# Patient Record
Sex: Female | Born: 1995 | Race: White | Hispanic: No | Marital: Single | Smoking: Former smoker
Health system: Southern US, Community
[De-identification: ages and names within clinical notes are randomized; demographics above are authoritative.]

## PROBLEM LIST (undated history)

## (undated) DIAGNOSIS — N159 Renal tubulo-interstitial disease, unspecified: Secondary | ICD-10-CM

## (undated) HISTORY — PX: NO PAST SURGERIES: SHX2092

---
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
## 2017-10-28 ENCOUNTER — Ambulatory Visit: Payer: Self-pay | Admitting: Obstetrics and Gynecology

## 2017-11-24 ENCOUNTER — Emergency Department
Admission: EM | Admit: 2017-11-24 | Discharge: 2017-11-24 | Disposition: A | Discharge status: Home / Self Care | Payer: Managed Care, Other (non HMO) | Attending: Emergency Medicine | Admitting: Emergency Medicine

## 2017-11-24 ENCOUNTER — Other Ambulatory Visit: Payer: Self-pay

## 2017-11-24 ENCOUNTER — Encounter: Payer: Self-pay | Admitting: Emergency Medicine

## 2017-11-24 DIAGNOSIS — J029 Acute pharyngitis, unspecified: Secondary | ICD-10-CM | POA: Diagnosis present

## 2017-11-24 DIAGNOSIS — J020 Streptococcal pharyngitis: Secondary | ICD-10-CM | POA: Insufficient documentation

## 2017-11-24 LAB — GROUP A STREP BY PCR: GROUP A STREP BY PCR: NOT DETECTED

## 2017-11-24 MED ORDER — HYDROCODONE-ACETAMINOPHEN 7.5-325 MG/15ML PO SOLN
ORAL | Status: AC
  Filled 2017-11-24: qty 15

## 2017-11-24 MED ORDER — AMOXICILLIN-POT CLAVULANATE 875-125 MG PO TABS: 1.0000 | ORAL_TABLET | Freq: Two times a day (BID) | ORAL | 0 refills | Status: AC

## 2017-11-24 MED ORDER — HYDROCODONE-ACETAMINOPHEN 7.5-325 MG/15ML PO SOLN
15.0000 mL | Freq: Once | ORAL | Status: AC
Start: 2017-11-24 — End: 2017-11-24
  Administered 2017-11-24: 15 mL via ORAL

## 2017-11-24 MED ORDER — PREDNISOLONE SODIUM PHOSPHATE 15 MG/5ML PO SOLN: 60.0000 mg | Freq: Every day | ORAL | 0 refills | Status: AC

## 2017-11-24 MED ORDER — PENICILLIN G BENZATHINE 600000 UNIT/ML IM SUSP
600000.0000 [IU] | Freq: Once | INTRAMUSCULAR | Status: AC
  Administered 2017-11-24: 600000 [IU] via INTRAMUSCULAR

## 2017-11-24 MED ORDER — PREDNISOLONE SODIUM PHOSPHATE 15 MG/5ML PO SOLN
60.0000 mg | Freq: Once | ORAL | Status: AC
  Administered 2017-11-24: 60 mg via ORAL

## 2017-11-24 MED ORDER — LIDOCAINE VISCOUS 2 % MT SOLN
15.0000 mL | Freq: Once | OROMUCOSAL | Status: AC
  Administered 2017-11-24: 15 mL via OROMUCOSAL
  Filled 2017-11-24: qty 15

## 2017-11-24 MED ORDER — PENICILLIN G BENZATHINE & PROC 1200000 UNIT/2ML IM SUSP
1.2000 10*6.[IU] | Freq: Once | INTRAMUSCULAR | Status: DC
  Filled 2017-11-24: qty 2

## 2017-11-24 NOTE — ED Triage Notes (Signed)
Patient states her pain started up yesterday and tonight its "nearly unbearable" and she has pain talking and swallowing.  Her bilateral tonsils are red with white patches on the right side.  VS WNL.

## 2017-11-24 NOTE — ED Provider Notes (Signed)
[facility] Emergency Department Provider Note   First MD Initiated Contact with Patient 11/24/17 [phone]     (approximate)  I have reviewed the triage vital signs and the nursing notes.   HISTORY  Chief Complaint Sore Throat    HPI Leslie Kerr is a 22 y.o. female presents to the emergency department with a 1 day history of 10 out of 10 throat pain.  Patient admits to tonsillar swelling redness and white patches predominantly on the right side.  Patient denies any fever afebrile on presentation.  Patient states pain is worsened with swallowing.  Patient denies any known sick contact.  Patient does admit to previous history of strep throat   Past medical history Strep pharyngitis There are no active problems to display for this patient.   Past surgical history None  Prior to Admission medications   Medication Sig Start Date End Date Taking? Authorizing Provider  amoxicillin-clavulanate (AUGMENTIN) 875-125 MG tablet Take 1 tablet by mouth 2 (two) times daily for 10 days. 11/24/17 12/04/17  Darci CurrentBrown, [place] N, MD  prednisoLONE (ORAPRED) 15 MG/5ML solution Take 20 mLs (60 mg total) by mouth daily for 5 days. 11/24/17 11/29/17  Darci CurrentBrown, [place] N, MD    Allergies No known drug allergies   Family history Noncontributory  Social History Social History   Tobacco Use  . Smoking status: Never Smoker  . Smokeless tobacco: Never Used  Substance Use Topics  . Alcohol use: No    Frequency: Never  . Drug use: No    Review of Systems Constitutional: No fever/chills Eyes: No visual changes. ENT: Positive for sore throat Cardiovascular: Denies chest pain. Respiratory: Denies shortness of breath. Gastrointestinal: No abdominal pain.  No nausea, no vomiting.  No diarrhea.  No constipation. Genitourinary: Negative for dysuria. Musculoskeletal: Negative for neck pain.  Negative for back pain. Integumentary: Negative for rash. Neurological: Negative for  headaches, focal weakness or numbness.   ____________________________________________   PHYSICAL EXAM:  VITAL SIGNS: ED Triage Vitals  Enc Vitals Group     BP 11/24/17 0521 127/65     Pulse Rate 11/24/17 0521 89     Resp 11/24/17 0521 18     Temp 11/24/17 0521 99.1 F (37.3 C)     Temp Source 11/24/17 0521 Oral     SpO2 11/24/17 0521 97 %     Weight --      Height --      Head Circumference --      Peak Flow --      Pain Score 11/24/17 0522 8     Pain Loc --      Pain Edu? --      Excl. in GC? --     Constitutional: Alert and oriented.  Apparent discomfort.  Eyes: Conjunctivae are normal.  Head: Atraumatic. Ears:  Healthy appearing ear canals and TMs bilaterally Nose: No congestion/rhinnorhea. Mouth/Throat: Mucous membranes are moist.pharyngeal erythema with exudates noted.  Tonsillar hypertrophy right greater than left.   Neck: No stridor.  Palpable anterior cervical lymphadenopathy. Cardiovascular: Normal rate, regular rhythm. Good peripheral circulation. Grossly normal heart sounds. Respiratory: Normal respiratory effort.  No retractions. Lungs CTAB. Gastrointestinal: Soft and nontender. No distention.  Musculoskeletal: No lower extremity tenderness nor edema. No gross deformities of extremities. Neurologic:  Normal speech and language. No gross focal neurologic deficits are appreciated.  Skin:  Skin is warm, dry and intact. No rash noted.   ____________________________________________   LABS (all labs ordered are listed, but only  abnormal results are displayed)  Labs Reviewed  GROUP A STREP BY PCR     Procedures   ____________________________________________   INITIAL IMPRESSION / ASSESSMENT AND PLAN / ED COURSE  As part of my medical decision making, I reviewed the following data within the electronic MEDICAL RECORD NUMBER9 year old female presenting to the emergency department history and physical exam concerning for strep pharyngitis versus other  etiology of pharyngitis versus tonsillitis.  Patient given IM penicillin G 1,200,000 units.  Patient will be prescribed Augmentin for home ____________________________________________  FINAL CLINICAL IMPRESSION(S) / ED DIAGNOSES  Final diagnoses:  Acute pharyngitis, unspecified etiology  Strep throat     MEDICATIONS GIVEN DURING THIS VISIT:  Medications  lidocaine (XYLOCAINE) 2 % viscous mouth solution 15 mL (15 mLs Mouth/Throat Given 11/24/17 0613)  prednisoLONE (ORAPRED) 15 MG/5ML solution 60 mg (60 mg Oral Given 11/24/17 0613)  penicillin G benzathine (BICILLIN-LA) 600000 UNIT/ML injection 600,000 Units (600,000 Units Intramuscular Given 11/24/17 0618)  HYDROcodone-acetaminophen (HYCET) 7.5-325 mg/15 ml solution 15 mL (15 mLs Oral Given 11/24/17 0631)     ED Discharge Orders        Ordered    amoxicillin-clavulanate (AUGMENTIN) 875-125 MG tablet  2 times daily     11/24/17 0632    prednisoLONE (ORAPRED) 15 MG/5ML solution  Daily     11/24/17 1610       Note:  This document was prepared using Dragon voice recognition software and may include unintentional dictation errors.    Darci Current, MD 11/24/17 [phone]

## 2017-12-26 ENCOUNTER — Inpatient Hospital Stay
Admission: EM | Admit: 2017-12-26 | Discharge: 2017-12-28 | DRG: 689 | Disposition: A | Discharge status: Home / Self Care | Payer: Managed Care, Other (non HMO) | Attending: Internal Medicine | Admitting: Internal Medicine

## 2017-12-26 ENCOUNTER — Encounter: Payer: Self-pay | Admitting: Emergency Medicine

## 2017-12-26 ENCOUNTER — Other Ambulatory Visit: Payer: Self-pay

## 2017-12-26 ENCOUNTER — Emergency Department: Payer: Managed Care, Other (non HMO)

## 2017-12-26 DIAGNOSIS — N12 Tubulo-interstitial nephritis, not specified as acute or chronic: Secondary | ICD-10-CM

## 2017-12-26 DIAGNOSIS — N10 Acute tubulo-interstitial nephritis: Principal | ICD-10-CM | POA: Diagnosis present

## 2017-12-26 DIAGNOSIS — A4151 Sepsis due to Escherichia coli [E. coli]: Secondary | ICD-10-CM | POA: Diagnosis present

## 2017-12-26 DIAGNOSIS — R1031 Right lower quadrant pain: Secondary | ICD-10-CM | POA: Diagnosis not present

## 2017-12-26 LAB — COMPREHENSIVE METABOLIC PANEL
ALT: 20 U/L (ref 14–54)
ANION GAP: 11 (ref 5–15)
AST: 17 U/L (ref 15–41)
Albumin: 3.9 g/dL (ref 3.5–5.0)
Alkaline Phosphatase: 77 U/L (ref 38–126)
BUN: 9 mg/dL (ref 6–20)
CALCIUM: 9.2 mg/dL (ref 8.9–10.3)
CHLORIDE: 105 mmol/L (ref 101–111)
CO2: 23 mmol/L (ref 22–32)
Creatinine, Ser: 0.69 mg/dL (ref 0.44–1.00)
Glucose, Bld: 112 mg/dL — ABNORMAL HIGH (ref 65–99)
Potassium: 3.7 mmol/L (ref 3.5–5.1)
SODIUM: 139 mmol/L (ref 135–145)
Total Bilirubin: 1.3 mg/dL — ABNORMAL HIGH (ref 0.3–1.2)
Total Protein: 7.6 g/dL (ref 6.5–8.1)

## 2017-12-26 LAB — URINALYSIS, COMPLETE (UACMP) WITH MICROSCOPIC
Bilirubin Urine: NEGATIVE
GLUCOSE, UA: NEGATIVE mg/dL
KETONES UR: 5 mg/dL — AB
Nitrite: POSITIVE — AB
PH: 6 (ref 5.0–8.0)
Protein, ur: 100 mg/dL — AB
SPECIFIC GRAVITY, URINE: 1.013 (ref 1.005–1.030)

## 2017-12-26 LAB — CBC
HCT: 42.2 % (ref 35.0–47.0)
HEMOGLOBIN: 14.1 g/dL (ref 12.0–16.0)
MCH: 26.6 pg (ref 26.0–34.0)
MCHC: 33.5 g/dL (ref 32.0–36.0)
MCV: 79.4 fL — ABNORMAL LOW (ref 80.0–100.0)
PLATELETS: 281 10*3/uL (ref 150–440)
RBC: 5.31 MIL/uL — AB (ref 3.80–5.20)
RDW: 14.6 % — ABNORMAL HIGH (ref 11.5–14.5)
WBC: 17.2 10*3/uL — AB (ref 3.6–11.0)

## 2017-12-26 LAB — LACTIC ACID, PLASMA
LACTIC ACID, VENOUS: 0.6 mmol/L (ref 0.5–1.9)
Lactic Acid, Venous: 0.8 mmol/L (ref 0.5–1.9)

## 2017-12-26 LAB — POCT PREGNANCY, URINE: Preg Test, Ur: NEGATIVE

## 2017-12-26 LAB — LIPASE, BLOOD: LIPASE: 25 U/L (ref 11–51)

## 2017-12-26 MED ORDER — ENOXAPARIN SODIUM 40 MG/0.4ML ~~LOC~~ SOLN
40.0000 mg | SUBCUTANEOUS | Status: DC
  Administered 2017-12-26 – 2017-12-27 (×2): 40 mg via SUBCUTANEOUS
  Filled 2017-12-26 (×2): qty 0.4

## 2017-12-26 MED ORDER — ACETAMINOPHEN 325 MG PO TABS
650.0000 mg | ORAL_TABLET | Freq: Four times a day (QID) | ORAL | Status: DC | PRN
  Administered 2017-12-27: 650 mg via ORAL
  Filled 2017-12-26: qty 2

## 2017-12-26 MED ORDER — ONDANSETRON HCL 4 MG/2ML IJ SOLN
4.0000 mg | Freq: Once | INTRAMUSCULAR | Status: AC
  Administered 2017-12-26: 4 mg via INTRAVENOUS
  Filled 2017-12-26: qty 2

## 2017-12-26 MED ORDER — POLYETHYLENE GLYCOL 3350 17 G PO PACK
17.0000 g | PACK | Freq: Every day | ORAL | Status: DC | PRN
  Filled 2017-12-26: qty 1

## 2017-12-26 MED ORDER — OXYCODONE HCL 5 MG PO TABS
5.0000 mg | ORAL_TABLET | ORAL | Status: DC | PRN
  Administered 2017-12-27 – 2017-12-28 (×7): 5 mg via ORAL
  Filled 2017-12-26 (×7): qty 1

## 2017-12-26 MED ORDER — IOPAMIDOL (ISOVUE-300) INJECTION 61%
30.0000 mL | Freq: Once | INTRAVENOUS | Status: AC | PRN
  Administered 2017-12-26: 30 mL via ORAL

## 2017-12-26 MED ORDER — SODIUM CHLORIDE 0.9 % IV BOLUS (SEPSIS)
1000.0000 mL | Freq: Once | INTRAVENOUS | Status: AC
  Administered 2017-12-26: 1000 mL via INTRAVENOUS

## 2017-12-26 MED ORDER — POTASSIUM CHLORIDE IN NACL 20-0.9 MEQ/L-% IV SOLN
INTRAVENOUS | Status: DC
  Administered 2017-12-26 – 2017-12-28 (×5): via INTRAVENOUS
  Filled 2017-12-26 (×8): qty 1000

## 2017-12-26 MED ORDER — ONDANSETRON HCL 4 MG PO TABS: 4.0000 mg | ORAL_TABLET | Freq: Four times a day (QID) | ORAL | Status: DC | PRN

## 2017-12-26 MED ORDER — MORPHINE SULFATE (PF) 4 MG/ML IV SOLN
4.0000 mg | Freq: Once | INTRAVENOUS | Status: AC
  Administered 2017-12-26: 4 mg via INTRAVENOUS
  Filled 2017-12-26: qty 1

## 2017-12-26 MED ORDER — DOCUSATE SODIUM 100 MG PO CAPS
100.0000 mg | ORAL_CAPSULE | Freq: Two times a day (BID) | ORAL | Status: DC
  Administered 2017-12-26 – 2017-12-28 (×4): 100 mg via ORAL
  Filled 2017-12-26 (×4): qty 1

## 2017-12-26 MED ORDER — MORPHINE SULFATE (PF) 2 MG/ML IV SOLN
2.0000 mg | INTRAVENOUS | Status: DC | PRN
  Administered 2017-12-27: 2 mg via INTRAVENOUS
  Filled 2017-12-26: qty 1

## 2017-12-26 MED ORDER — SODIUM CHLORIDE 0.9 % IV SOLN
2.0000 g | Freq: Once | INTRAVENOUS | Status: AC
  Administered 2017-12-26: 2 g via INTRAVENOUS
  Filled 2017-12-26: qty 20

## 2017-12-26 MED ORDER — ONDANSETRON HCL 4 MG/2ML IJ SOLN
4.0000 mg | Freq: Four times a day (QID) | INTRAMUSCULAR | Status: DC | PRN
  Administered 2017-12-27 (×2): 4 mg via INTRAVENOUS
  Filled 2017-12-26 (×2): qty 2

## 2017-12-26 MED ORDER — SODIUM CHLORIDE 0.9 % IV SOLN
1.0000 g | INTRAVENOUS | Status: DC
  Filled 2017-12-26: qty 10

## 2017-12-26 MED ORDER — ACETAMINOPHEN 650 MG RE SUPP: 650.0000 mg | Freq: Four times a day (QID) | RECTAL | Status: DC | PRN

## 2017-12-26 MED ORDER — IOPAMIDOL (ISOVUE-300) INJECTION 61%
100.0000 mL | Freq: Once | INTRAVENOUS | Status: AC | PRN
  Administered 2017-12-26: 100 mL via INTRAVENOUS

## 2017-12-26 NOTE — Progress Notes (Signed)
Pharmacy Antibiotic Note  Leslie Kerr is a 22 y.o. female admitted on 12/26/2017 with UTI.  Pharmacy has been consulted for ceftriaxone dosing.  Plan: Received ceftriaxone 2g IV once in ED.  Will start ceftriaxone 1g IV q24h   Height: 5\' 3"  (160 cm) Weight: 224 lb (101.6 kg) IBW/kg (Calculated) : 52.4  Temp (24hrs), Avg:100 F (37.8 C), Min:100 F (37.8 C), Max:100 F (37.8 C)  Recent Labs  Lab 12/26/17 1251  WBC 17.2*  CREATININE 0.69    Estimated Creatinine Clearance: 127.7 mL/min (by C-G formula based on SCr of 0.69 mg/dL).    No Known Allergies  Antimicrobials this admission: 3/15 Ceftriaxone >>   Dose adjustments this admission:   Microbiology results: 3/15 BCx: sent 3/15 UCx: sent  Thank you for allowing pharmacy to be a part of this patient's care.  Leslie Kerr, PharmD Pharmacy Resident 12/26/2017 6:27 PM

## 2017-12-26 NOTE — H&P (Signed)
[facility] - [place] [facility]   PATIENT NAME: Leslie Kerr    MR#:  [id number]  DATE OF BIRTH:  Jul 01, 1996  DATE OF ADMISSION:  12/26/2017  PRIMARY CARE PHYSICIAN: Patient, No Pcp Per   REQUESTING/REFERRING PHYSICIAN: Dr. Pershing ProudSchaevitz  CHIEF COMPLAINT:   Chief Complaint  Patient presents with  . Abdominal Pain    HISTORY OF PRESENT ILLNESS:  Leslie Kerr  is a 22 y.o. female with no significant past medical history presents to the hospital complaining of 1 day of right flank pain, fever.  Patient here was found to be tachycardic with a fever of 103.  CT scan of the abdomen and pelvis showed right pyelonephritis.  UTI on urinalysis.  Patient continues to be tachycardic in spite of fluid bolus.  Will be admitted as inpatient.  PAST MEDICAL HISTORY:  History reviewed. No pertinent past medical history.  No hypertension or diabetes  PAST SURGICAL HISTORY:  History reviewed. No pertinent surgical history.  SOCIAL HISTORY:   Social History   Tobacco Use  . Smoking status: Never Smoker  . Smokeless tobacco: Never Used  Substance Use Topics  . Alcohol use: No    Frequency: Never    FAMILY HISTORY:  No family history on file. No CAD in the family  DRUG ALLERGIES:  No Known Allergies  REVIEW OF SYSTEMS:   Review of Systems  Constitutional: Positive for malaise/fatigue. Negative for chills and fever.  HENT: Negative for sore throat.   Eyes: Negative for blurred vision, double vision and pain.  Respiratory: Negative for cough, hemoptysis, shortness of breath and wheezing.   Cardiovascular: Negative for chest pain, palpitations, orthopnea and leg swelling.  Gastrointestinal: Positive for abdominal pain and nausea. Negative for constipation, diarrhea, heartburn and vomiting.  Genitourinary: Negative for dysuria and hematuria.  Musculoskeletal: Negative for back pain and joint pain.  Skin: Negative for rash.  Neurological: Positive for weakness.  Negative for sensory change, speech change, focal weakness and headaches.  Endo/Heme/Allergies: Does not bruise/bleed easily.  Psychiatric/Behavioral: Negative for depression. The patient is not nervous/anxious.     MEDICATIONS AT HOME:   Prior to Admission medications   Not on File     VITAL SIGNS:  Blood pressure 126/79, pulse 88, temperature 100 F (37.8 C), temperature source Oral, resp. rate 16, height 5\' 3"  (1.6 m), weight 101.6 kg (224 lb), last menstrual period 12/25/2017, SpO2 98 %.  PHYSICAL EXAMINATION:  Physical Exam  GENERAL:  22 y.o.-year-old patient lying in the bed with no acute distress.  Obese EYES: Pupils equal, round, reactive to light and accommodation. No scleral icterus. Extraocular muscles intact.  HEENT: Head atraumatic, normocephalic. Oropharynx and nasopharynx clear. No oropharyngeal erythema, moist oral mucosa  NECK:  Supple, no jugular venous distention. No thyroid enlargement, no tenderness.  LUNGS: Normal breath sounds bilaterally, no wheezing, rales, rhonchi. No use of accessory muscles of respiration.  CARDIOVASCULAR: S1, S2 normal. No murmurs, rubs, or gallops.  ABDOMEN: Soft, right flank tenderness, nondistended. Bowel sounds present. No organomegaly or mass.  EXTREMITIES: No pedal edema, cyanosis, or clubbing. + 2 pedal & radial pulses b/l.   NEUROLOGIC: Cranial nerves II through XII are intact. No focal Motor or sensory deficits appreciated b/l PSYCHIATRIC: The patient is alert and oriented x 3. Good affect.  SKIN: No obvious rash, lesion, or ulcer.   LABORATORY PANEL:   CBC Recent Labs  Lab 12/26/17 1251  WBC 17.2*  HGB 14.1  HCT 42.2  PLT 281   ------------------------------------------------------------------------------------------------------------------  Chemistries  Recent Labs  Lab 12/26/17 1251  NA 139  K 3.7  CL 105  CO2 23  GLUCOSE 112*  BUN 9  CREATININE 0.69  CALCIUM 9.2  AST 17  ALT 20  ALKPHOS 77  BILITOT  1.3*   ------------------------------------------------------------------------------------------------------------------  Cardiac Enzymes No results for input(s): TROPONINI in the last 168 hours. ------------------------------------------------------------------------------------------------------------------  RADIOLOGY:  US Transvaginal Non-ob  Result Date: 12/26/2017 CLINICAL DATA:  Right-sided pelvic pain EXAM: TRANSABDOMINAL AND TRANSVAGINAL ULTRASOUND OF PELVIS DOPPLER ULTRASOUND OF OVARIES TECHNIQUE: Study was performed transabdominally to optimize pelvic field of view evaluation and transvaginally to optimize internal visceral architecture evaluation. Color and duplex Doppler ultrasound was utilized to evaluate blood flow to the ovaries. COMPARISON:  September 01, 2017 FINDINGS: Uterus Measurements: 6.6 x 2.9 x 3.8 cm. No fibroids or other mass visualized. Endometrium Thickness: 3 mm.  No focal abnormality visualized. Right ovary Measurements: 4.8 x 2.4 x 1.8 cm. Normal appearance/no adnexal mass. Left ovary Measurements: 3.1 x 1.2 x 1.8 cm. Normal appearance/no adnexal mass. Pulsed Doppler evaluation of both ovaries demonstrates normal low-resistance arterial and venous waveforms. Other findings No abnormal free fluid. IMPRESSION: No intrauterine or extrauterine pelvic or adnexal mass. No inflammatory focus. No ovarian torsion on either side. Electronically Signed   By: Bretta Bang III M.D.   On: 12/26/2017 14:59   US Pelvis Complete  Result Date: 12/26/2017 CLINICAL DATA:  Right-sided pelvic pain EXAM: TRANSABDOMINAL AND TRANSVAGINAL ULTRASOUND OF PELVIS DOPPLER ULTRASOUND OF OVARIES TECHNIQUE: Study was performed transabdominally to optimize pelvic field of view evaluation and transvaginally to optimize internal visceral architecture evaluation. Color and duplex Doppler ultrasound was utilized to evaluate blood flow to the ovaries. COMPARISON:  September 01, 2017 FINDINGS: Uterus  Measurements: 6.6 x 2.9 x 3.8 cm. No fibroids or other mass visualized. Endometrium Thickness: 3 mm.  No focal abnormality visualized. Right ovary Measurements: 4.8 x 2.4 x 1.8 cm. Normal appearance/no adnexal mass. Left ovary Measurements: 3.1 x 1.2 x 1.8 cm. Normal appearance/no adnexal mass. Pulsed Doppler evaluation of both ovaries demonstrates normal low-resistance arterial and venous waveforms. Other findings No abnormal free fluid. IMPRESSION: No intrauterine or extrauterine pelvic or adnexal mass. No inflammatory focus. No ovarian torsion on either side. Electronically Signed   By: Bretta Bang III M.D.   On: 12/26/2017 14:59   Ct Abdomen Pelvis W Contrast  Result Date: 12/26/2017 CLINICAL DATA:  Abdominal pain. Right lower quadrant pain beginning last evening. Nausea and vomiting. EXAM: CT ABDOMEN AND PELVIS WITH CONTRAST TECHNIQUE: Multidetector CT imaging of the abdomen and pelvis was performed using the standard protocol following bolus administration of intravenous contrast. CONTRAST:  ISOVUE-300 IOPAMIDOL (ISOVUE-300) INJECTION 61% COMPARISON:  Pelvic ultrasound from the same day. CT of the abdomen pelvis 09/01/2014. FINDINGS: Lower chest: The lung bases are clear without focal nodule, mass, or airspace disease. The heart size is normal. No significant pleural or pericardial effusion is present. Hepatobiliary: No focal liver abnormality is seen. No gallstones, gallbladder wall thickening, or biliary dilatation. Pancreas: Unremarkable. No pancreatic ductal dilatation or surrounding inflammatory changes. Spleen: Normal in size without focal abnormality. Adrenals/Urinary Tract: Adrenal glands are normal bilaterally. The left kidney in ureter are within normal limits. There is slight heterogeneity of the right kidney. There is enhancement of the mucosa in the right renal collecting system and ureter. No obstructing mass lesion is present. The urinary bladder is within normal limits.  Stomach/Bowel: The stomach is mildly distended without obstruction. The duodenum is normal. Small bowel  is within normal limits. The terminal ileum is visualized and normal. The appendix is within normal limits. The ascending and transverse colon are. The descending and sigmoid colon are unremarkable. Vascular/Lymphatic: No significant vascular findings are present. No enlarged abdominal or pelvic lymph nodes. Reproductive: Uterus and bilateral adnexa are unremarkable. Other: No abdominal wall hernia or abnormality. No abdominopelvic ascites. Musculoskeletal: Vertebral body heights and alignment are normal. No focal lytic or blastic lesions are evident. IMPRESSION: 1. Subtle heterogeneous enhancement of the right kidney inflammatory changes as well as inflammatory changes about the right ureter raising concern for pyelonephritis in the setting of a urinary tract infection. 2. The left kidney is unremarkable. 3. Normal appearance of the appendix. Electronically Signed   By: Marin Roberts M.D.   On: 12/26/2017 16:08   [place] Art/ven Flow Abd Pelv Doppler  Result Date: 12/26/2017 CLINICAL DATA:  Right-sided pelvic pain EXAM: TRANSABDOMINAL AND TRANSVAGINAL ULTRASOUND OF PELVIS DOPPLER ULTRASOUND OF OVARIES TECHNIQUE: Study was performed transabdominally to optimize pelvic field of view evaluation and transvaginally to optimize internal visceral architecture evaluation. Color and duplex Doppler ultrasound was utilized to evaluate blood flow to the ovaries. COMPARISON:  September 01, 2017 FINDINGS: Uterus Measurements: 6.6 x 2.9 x 3.8 cm. No fibroids or other mass visualized. Endometrium Thickness: 3 mm.  No focal abnormality visualized. Right ovary Measurements: 4.8 x 2.4 x 1.8 cm. Normal appearance/no adnexal mass. Left ovary Measurements: 3.1 x 1.2 x 1.8 cm. Normal appearance/no adnexal mass. Pulsed Doppler evaluation of both ovaries demonstrates normal low-resistance arterial and venous waveforms. Other  findings No abnormal free fluid. IMPRESSION: No intrauterine or extrauterine pelvic or adnexal mass. No inflammatory focus. No ovarian torsion on either side. Electronically Signed   By: Bretta Bang III M.D.   On: 12/26/2017 14:59     IMPRESSION AND PLAN:   *Acute right pyelonephritis with sepsis present on admission Stat IV fluid bolus.  IV ceftriaxone.  Urine and blood cultures. Patient continues to have fever of 103 and tachycardic with severe pain.  Pain medications as needed.  *Leukocytosis secondary to acute infection  *DVT prophylaxis with Lovenox  All the records are reviewed and case discussed with ED provider. Management plans discussed with the patient, family and they are in agreement.  CODE STATUS: Full code  TOTAL CC TIME TAKING CARE OF THIS PATIENT: 40 minutes.   Orie Fisherman M.D on 12/26/2017 at 6:27 PM  Between 7am to 6pm - Pager - [phone]  After 6pm go to [email or web address] - password [email or web address]  [facility] [place] [facility]  Office  [phone]  CC: Primary care physician; Patient, No Pcp Per  Note: This dictation was prepared with Dragon dictation along with smaller phrase technology. Any transcriptional errors that result from this process are unintentional.

## 2017-12-26 NOTE — ED Provider Notes (Signed)
[facility] Emergency Department Provider Note  ___________________________________________   First MD Initiated Contact with Patient 12/26/17 1455     (approximate)  I have reviewed the triage vital signs and the nursing notes.   HISTORY  Chief Complaint Abdominal Pain   HPI Leslie Kerr is a 22 y.o. female without any chronic medical conditions was presented with right lower quadrant abdominal pain which she describes as a sharp 7 out of 10 at this time.  She says that it is worse with movement.  Says the pain started last night and has been progressively worsening.  She states that she has vomited several times today.  Denies any burning or frequency with urination.  No history of UTI.  No history of abdominal surgeries.  Patient denies any vaginal bleeding or discharge.  Patient says that the pain was of gradual onset.  History reviewed. No pertinent past medical history.  There are no active problems to display for this patient.   History reviewed. No pertinent surgical history.  Prior to Admission medications   Not on File    Allergies Patient has no known allergies.  No family history on file.  Social History Social History   Tobacco Use  . Smoking status: Never Smoker  . Smokeless tobacco: Never Used  Substance Use Topics  . Alcohol use: No    Frequency: Never  . Drug use: No    Review of Systems  Constitutional: No fever/chills Eyes: No visual changes. ENT: No sore throat. Cardiovascular: Denies chest pain. Respiratory: Denies shortness of breath. Gastrointestinal: No diarrhea.  No constipation. Genitourinary: Negative for dysuria. Musculoskeletal: Negative for back pain. Skin: Negative for rash. Neurological: Negative for headaches, focal weakness or numbness.   ____________________________________________   PHYSICAL EXAM:  VITAL SIGNS: ED Triage Vitals  Enc Vitals Group     BP 12/26/17 1244 120/77     Pulse  Rate 12/26/17 1244 (!) 112     Resp 12/26/17 1244 17     Temp 12/26/17 1244 100 F (37.8 C)     Temp Source 12/26/17 1244 Oral     SpO2 12/26/17 1244 92 %     Weight 12/26/17 1245 224 lb (101.6 kg)     Height 12/26/17 1245 5\' 3"  (1.6 m)     Head Circumference --      Peak Flow --      Pain Score 12/26/17 1245 8     Pain Loc --      Pain Edu? --      Excl. in GC? --     Constitutional: Alert and oriented.  Patient appears uncomfortable. Eyes: Conjunctivae are normal.  Head: Atraumatic. Nose: No congestion/rhinnorhea. Mouth/Throat: Mucous membranes are moist.  Neck: No stridor.   Cardiovascular: Normal rate, regular rhythm. Grossly normal heart sounds.  Respiratory: Normal respiratory effort.  No retractions. Lungs CTAB. Gastrointestinal: Soft with moderate to severe right lower quadrant tenderness over McBurney's point.  No rigidity.  Also with right CVA tenderness to palpation that is mild to moderate.   Musculoskeletal: No lower extremity tenderness nor edema.  No joint effusions. Neurologic:  Normal speech and language. No gross focal neurologic deficits are appreciated. Skin:  Skin is warm, dry and intact. No rash noted. Psychiatric: Mood and affect are normal. Speech and behavior are normal.  ____________________________________________   LABS (all labs ordered are listed, but only abnormal results are displayed)  Labs Reviewed  COMPREHENSIVE METABOLIC PANEL - Abnormal; Notable for the following components:  Result Value   Glucose, Bld 112 (*)    Total Bilirubin 1.3 (*)    All other components within normal limits  CBC - Abnormal; Notable for the following components:   WBC 17.2 (*)    RBC 5.31 (*)    MCV 79.4 (*)    RDW 14.6 (*)    All other components within normal limits  URINALYSIS, COMPLETE (UACMP) WITH MICROSCOPIC - Abnormal; Notable for the following components:   Color, Urine YELLOW (*)    APPearance CLOUDY (*)    Hgb urine dipstick MODERATE (*)     Ketones, ur 5 (*)    Protein, ur 100 (*)    Nitrite POSITIVE (*)    Leukocytes, UA MODERATE (*)    Bacteria, UA MANY (*)    Squamous Epithelial / LPF 0-5 (*)    All other components within normal limits  URINE CULTURE  CULTURE, BLOOD (ROUTINE X 2)  CULTURE, BLOOD (ROUTINE X 2)  LIPASE, BLOOD  LACTIC ACID, PLASMA  LACTIC ACID, PLASMA  POCT PREGNANCY, URINE  POC URINE PREG, ED   ____________________________________________  EKG  ________________________________________  RADIOLOGY  Ultrasound of the pelvis without any acute pathology. Pt with ct findings c/w pyelonephritis.  ____________________________________________   PROCEDURES  Procedure(s) performed:   Procedures  Critical Care performed:   ____________________________________________   INITIAL IMPRESSION / ASSESSMENT AND PLAN / ED COURSE  Pertinent labs & imaging results that were available during my care of the patient were reviewed by me and considered in my medical decision making (see chart for details).  Differential diagnosis includes, but is not limited to, acute appendicitis, renal colic, testicular torsion, urinary tract infection/pyelonephritis, prostatitis,  epididymitis, diverticulitis, small bowel obstruction or ileus, colitis, abdominal aortic aneurysm, gastroenteritis, hernia, etc. As part of my medical decision making, I reviewed the following data within the electronic MEDICAL RECORD NUMBER Notes from prior ED visits  ----------------------------------------- 5:54 PM on 12/26/2017 -----------------------------------------  Patient at this time with Reiger's.  I retook her temperature and it is 103.  Also says that the pain is increasing.  Sepsis alert called.  Patient to be started on ceftriaxone.  To be admitted to the hospital because of clinical worsening and now febrile.  Also with a heart rate of 125 at this time.  Patient will need for admission.  Signed out to Dr.  Elpidio Anis. ____________________________________________   FINAL CLINICAL IMPRESSION(S) / ED DIAGNOSES  Pyelonephritis.    NEW MEDICATIONS STARTED DURING THIS VISIT:  New Prescriptions   No medications on file     Note:  This document was prepared using Dragon voice recognition software and may include unintentional dictation errors.     Myrna Blazer, MD 12/26/17 [phone]

## 2017-12-26 NOTE — ED Triage Notes (Signed)
Patient presents to ED via [facility] from [facility] for abdominal pain. Patient reports RLQ pain since last night. Denies diarrhea, reports N/V.

## 2017-12-26 NOTE — ED Notes (Signed)
Dr. Scotty CourtStafford and Dr. Alphonzo LemmingsMcShane notified of patient presentation and positive psoas sign. See orders.

## 2017-12-27 LAB — CBC
HCT: 37.3 % (ref 35.0–47.0)
HEMOGLOBIN: 12 g/dL (ref 12.0–16.0)
MCH: 26.3 pg (ref 26.0–34.0)
MCHC: 32.1 g/dL (ref 32.0–36.0)
MCV: 81.9 fL (ref 80.0–100.0)
PLATELETS: 215 10*3/uL (ref 150–440)
RBC: 4.55 MIL/uL (ref 3.80–5.20)
RDW: 14.9 % — ABNORMAL HIGH (ref 11.5–14.5)
WBC: 13.4 10*3/uL — AB (ref 3.6–11.0)

## 2017-12-27 LAB — BASIC METABOLIC PANEL
ANION GAP: 9 (ref 5–15)
BUN: 7 mg/dL (ref 6–20)
CHLORIDE: 111 mmol/L (ref 101–111)
CO2: 20 mmol/L — AB (ref 22–32)
Calcium: 8.4 mg/dL — ABNORMAL LOW (ref 8.9–10.3)
Creatinine, Ser: 0.68 mg/dL (ref 0.44–1.00)
GFR calc non Af Amer: >60 mL/min (ref >60)
Glucose, Bld: 95 mg/dL (ref 65–99)
POTASSIUM: 3.9 mmol/L (ref 3.5–5.1)
SODIUM: 140 mmol/L (ref 135–145)

## 2017-12-27 LAB — BLOOD CULTURE ID PANEL (REFLEXED)
ACINETOBACTER BAUMANNII: NOT DETECTED
CANDIDA GLABRATA: NOT DETECTED
CANDIDA TROPICALIS: NOT DETECTED
Candida albicans: NOT DETECTED
Candida krusei: NOT DETECTED
Candida parapsilosis: NOT DETECTED
Carbapenem resistance: NOT DETECTED
ENTEROBACTER CLOACAE COMPLEX: NOT DETECTED
ESCHERICHIA COLI: DETECTED — AB
Enterobacteriaceae species: DETECTED — AB
Enterococcus species: NOT DETECTED
Haemophilus influenzae: NOT DETECTED
Klebsiella oxytoca: NOT DETECTED
Klebsiella pneumoniae: NOT DETECTED
LISTERIA MONOCYTOGENES: NOT DETECTED
NEISSERIA MENINGITIDIS: NOT DETECTED
PROTEUS SPECIES: NOT DETECTED
Pseudomonas aeruginosa: NOT DETECTED
SERRATIA MARCESCENS: NOT DETECTED
STAPHYLOCOCCUS SPECIES: NOT DETECTED
STREPTOCOCCUS AGALACTIAE: NOT DETECTED
STREPTOCOCCUS PNEUMONIAE: NOT DETECTED
Staphylococcus aureus (BCID): NOT DETECTED
Streptococcus pyogenes: NOT DETECTED
Streptococcus species: NOT DETECTED

## 2017-12-27 MED ORDER — SODIUM CHLORIDE 0.9 % IV SOLN
2.0000 g | INTRAVENOUS | Status: DC
  Administered 2017-12-27: 2 g via INTRAVENOUS
  Filled 2017-12-27 (×2): qty 20

## 2017-12-27 NOTE — Progress Notes (Signed)
[facility] [place] [facility]   PATIENT NAME: Leslie Kerr    MR#:  [id number]  DATE OF BIRTH:  04-28-1996  SUBJECTIVE:  Came in with fever and flank pain found to have UTI with pyelonephritis feels a little better.  Able to tolerate oral diet and fluids REVIEW OF SYSTEMS:   Review of Systems  Constitutional: Negative for chills, fever and weight loss.  HENT: Negative for ear discharge, ear pain and nosebleeds.   Eyes: Negative for blurred vision, pain and discharge.  Respiratory: Negative for sputum production, shortness of breath, wheezing and stridor.   Cardiovascular: Negative for chest pain, palpitations, orthopnea and PND.  Gastrointestinal: Negative for abdominal pain, diarrhea, nausea and vomiting.  Genitourinary: Positive for flank pain. Negative for frequency and urgency.  Musculoskeletal: Negative for back pain and joint pain.  Neurological: Positive for weakness. Negative for sensory change, speech change and focal weakness.  Psychiatric/Behavioral: Negative for depression and hallucinations. The patient is not nervous/anxious.    Tolerating Diet:yes Tolerating PT: not needed  DRUG ALLERGIES:  No Known Allergies  VITALS:  Blood pressure (!) 107/57, pulse 79, temperature 98.8 F (37.1 C), temperature source Oral, resp. rate 17, height 5\' 3"  (1.6 m), weight 101.6 kg (224 lb), last menstrual period 12/25/2017, SpO2 99 %.  PHYSICAL EXAMINATION:   Physical Exam  GENERAL:  22 y.o.-year-old patient lying in the bed with no acute distress.  EYES: Pupils equal, round, reactive to light and accommodation. No scleral icterus. Extraocular muscles intact.  HEENT: Head atraumatic, normocephalic. Oropharynx and nasopharynx clear.  NECK:  Supple, no jugular venous distention. No thyroid enlargement, no tenderness.  LUNGS: Normal breath sounds bilaterally, no wheezing, rales, rhonchi. No use of accessory muscles of respiration.  CARDIOVASCULAR: S1,  S2 normal. No murmurs, rubs, or gallops.  ABDOMEN: Soft, nontender, nondistended. Bowel sounds present. No organomegaly or mass.  EXTREMITIES: No cyanosis, clubbing or edema b/l.    NEUROLOGIC: Cranial nerves II through XII are intact. No focal Motor or sensory deficits b/l.   PSYCHIATRIC:  patient is alert and oriented x 3.  SKIN: No obvious rash, lesion, or ulcer.   LABORATORY PANEL:  CBC Recent Labs  Lab 12/27/17 0534  WBC 13.4*  HGB 12.0  HCT 37.3  PLT 215    Chemistries  Recent Labs  Lab 12/26/17 1251 12/27/17 0534  NA 139 140  K 3.7 3.9  CL 105 111  CO2 23 20*  GLUCOSE 112* 95  BUN 9 7  CREATININE 0.69 0.68  CALCIUM 9.2 8.4*  AST 17  --   ALT 20  --   ALKPHOS 77  --   BILITOT 1.3*  --    Cardiac Enzymes No results for input(s): TROPONINI in the last 168 hours. RADIOLOGY:  US Transvaginal Non-ob  Result Date: 12/26/2017 CLINICAL DATA:  Right-sided pelvic pain EXAM: TRANSABDOMINAL AND TRANSVAGINAL ULTRASOUND OF PELVIS DOPPLER ULTRASOUND OF OVARIES TECHNIQUE: Study was performed transabdominally to optimize pelvic field of view evaluation and transvaginally to optimize internal visceral architecture evaluation. Color and duplex Doppler ultrasound was utilized to evaluate blood flow to the ovaries. COMPARISON:  September 01, 2017 FINDINGS: Uterus Measurements: 6.6 x 2.9 x 3.8 cm. No fibroids or other mass visualized. Endometrium Thickness: 3 mm.  No focal abnormality visualized. Right ovary Measurements: 4.8 x 2.4 x 1.8 cm. Normal appearance/no adnexal mass. Left ovary Measurements: 3.1 x 1.2 x 1.8 cm. Normal appearance/no adnexal mass. Pulsed Doppler evaluation of both ovaries demonstrates normal low-resistance arterial  and venous waveforms. Other findings No abnormal free fluid. IMPRESSION: No intrauterine or extrauterine pelvic or adnexal mass. No inflammatory focus. No ovarian torsion on either side. Electronically Signed   By: Bretta Bang III M.D.   On: 12/26/2017  14:59   US Pelvis Complete  Result Date: 12/26/2017 CLINICAL DATA:  Right-sided pelvic pain EXAM: TRANSABDOMINAL AND TRANSVAGINAL ULTRASOUND OF PELVIS DOPPLER ULTRASOUND OF OVARIES TECHNIQUE: Study was performed transabdominally to optimize pelvic field of view evaluation and transvaginally to optimize internal visceral architecture evaluation. Color and duplex Doppler ultrasound was utilized to evaluate blood flow to the ovaries. COMPARISON:  September 01, 2017 FINDINGS: Uterus Measurements: 6.6 x 2.9 x 3.8 cm. No fibroids or other mass visualized. Endometrium Thickness: 3 mm.  No focal abnormality visualized. Right ovary Measurements: 4.8 x 2.4 x 1.8 cm. Normal appearance/no adnexal mass. Left ovary Measurements: 3.1 x 1.2 x 1.8 cm. Normal appearance/no adnexal mass. Pulsed Doppler evaluation of both ovaries demonstrates normal low-resistance arterial and venous waveforms. Other findings No abnormal free fluid. IMPRESSION: No intrauterine or extrauterine pelvic or adnexal mass. No inflammatory focus. No ovarian torsion on either side. Electronically Signed   By: Bretta Bang III M.D.   On: 12/26/2017 14:59   Ct Abdomen Pelvis W Contrast  Result Date: 12/26/2017 CLINICAL DATA:  Abdominal pain. Right lower quadrant pain beginning last evening. Nausea and vomiting. EXAM: CT ABDOMEN AND PELVIS WITH CONTRAST TECHNIQUE: Multidetector CT imaging of the abdomen and pelvis was performed using the standard protocol following bolus administration of intravenous contrast. CONTRAST:  ISOVUE-300 IOPAMIDOL (ISOVUE-300) INJECTION 61% COMPARISON:  Pelvic ultrasound from the same day. CT of the abdomen pelvis 09/01/2014. FINDINGS: Lower chest: The lung bases are clear without focal nodule, mass, or airspace disease. The heart size is normal. No significant pleural or pericardial effusion is present. Hepatobiliary: No focal liver abnormality is seen. No gallstones, gallbladder wall thickening, or biliary  dilatation. Pancreas: Unremarkable. No pancreatic ductal dilatation or surrounding inflammatory changes. Spleen: Normal in size without focal abnormality. Adrenals/Urinary Tract: Adrenal glands are normal bilaterally. The left kidney in ureter are within normal limits. There is slight heterogeneity of the right kidney. There is enhancement of the mucosa in the right renal collecting system and ureter. No obstructing mass lesion is present. The urinary bladder is within normal limits. Stomach/Bowel: The stomach is mildly distended without obstruction. The duodenum is normal. Small bowel is within normal limits. The terminal ileum is visualized and normal. The appendix is within normal limits. The ascending and transverse colon are. The descending and sigmoid colon are unremarkable. Vascular/Lymphatic: No significant vascular findings are present. No enlarged abdominal or pelvic lymph nodes. Reproductive: Uterus and bilateral adnexa are unremarkable. Other: No abdominal wall hernia or abnormality. No abdominopelvic ascites. Musculoskeletal: Vertebral body heights and alignment are normal. No focal lytic or blastic lesions are evident. IMPRESSION: 1. Subtle heterogeneous enhancement of the right kidney inflammatory changes as well as inflammatory changes about the right ureter raising concern for pyelonephritis in the setting of a urinary tract infection. 2. The left kidney is unremarkable. 3. Normal appearance of the appendix. Electronically Signed   By: Marin Roberts M.D.   On: 12/26/2017 16:08   [place] Art/ven Flow Abd Pelv Doppler  Result Date: 12/26/2017 CLINICAL DATA:  Right-sided pelvic pain EXAM: TRANSABDOMINAL AND TRANSVAGINAL ULTRASOUND OF PELVIS DOPPLER ULTRASOUND OF OVARIES TECHNIQUE: Study was performed transabdominally to optimize pelvic field of view evaluation and transvaginally to optimize internal visceral architecture evaluation. Color and duplex Doppler  ultrasound was utilized to evaluate  blood flow to the ovaries. COMPARISON:  September 01, 2017 FINDINGS: Uterus Measurements: 6.6 x 2.9 x 3.8 cm. No fibroids or other mass visualized. Endometrium Thickness: 3 mm.  No focal abnormality visualized. Right ovary Measurements: 4.8 x 2.4 x 1.8 cm. Normal appearance/no adnexal mass. Left ovary Measurements: 3.1 x 1.2 x 1.8 cm. Normal appearance/no adnexal mass. Pulsed Doppler evaluation of both ovaries demonstrates normal low-resistance arterial and venous waveforms. Other findings No abnormal free fluid. IMPRESSION: No intrauterine or extrauterine pelvic or adnexal mass. No inflammatory focus. No ovarian torsion on either side. Electronically Signed   By: Bretta BangWilliam  Woodruff III M.D.   On: 12/26/2017 14:59   ASSESSMENT AND PLAN:  Leslie Kerr  is a 22 y.o. female with no significant past medical history presents to the hospital complaining of 1 day of right flank pain, fever.  Patient here was found to be tachycardic with a fever of 103.  CT scan of the abdomen and pelvis showed right pyelonephritis.  UTI on urinalysis.  *Acute right pyelonephritis with sepsis present on admission -IV fluids -Continue IV ceftriaxone.  - Urine more than 100,000 colonies of gram-negative rods and blood cultures 1 out of 2 E. coli -Currently afebrile improving  *Leukocytosis secondary to acute infection  *DVT prophylaxis with Lovenox  If continues to show improvement and he remains hemodynamically stable will discharge home tomorrow.  Patient agreeable.  Case discussed with Care Management/Social Worker. Management plans discussed with the patient, family and they are in agreement.  CODE STATUS: Full  DVT Prophylaxis: Lovenox  TOTAL TIME TAKING CARE OF THIS PATIENT: 30 minutes.  >50% time spent on counselling and coordination of care  POSSIBLE D/C IN 1-2 DAYS, DEPENDING ON CLINICAL CONDITION.  Note: This dictation was prepared with Dragon dictation along with smaller phrase technology. Any  transcriptional errors that result from this process are unintentional.  Enedina FinnerSona Rosealie Reach M.D on 12/27/2017 at 3:31 PM  Between 7am to 6pm - Pager - [phone]  After 6pm go to [email or web address] - password [email or web address]  [facility]  Office  [phone]  CC: Primary care physician; Patient, No Pcp PerPatient ID: Leslie Kerr, female   DOB: 1995/10/30, 22 y.o.   MRN: [id number]

## 2017-12-27 NOTE — Progress Notes (Signed)
PHARMACY - PHYSICIAN COMMUNICATION CRITICAL VALUE ALERT - BLOOD CULTURE IDENTIFICATION (BCID)  Leslie Kerr is an 22 y.o. female who [facility] with a chief complaint of Abdominal Pain   Assessment:  DX with Pyelonephritis, now found to have bacteremia    Name of physician (or Provider) Contacted: Dr. Enedina FinnerSona Patel   Current antibiotics: Ceftriaxone   Changes to prescribed antibiotics recommended:  Recommendations declined by provider due to NO history of resistant organism. Will continue with ceftriaxone but increase dose to 2g IV every 24 hours.   Results for orders placed or performed during the hospital encounter of 12/26/17  Blood Culture ID Panel (Reflexed) (Collected: 12/26/2017  6:22 PM)  Result Value Ref Range   Enterococcus species NOT DETECTED NOT DETECTED   Listeria monocytogenes NOT DETECTED NOT DETECTED   Staphylococcus species NOT DETECTED NOT DETECTED   Staphylococcus aureus NOT DETECTED NOT DETECTED   Streptococcus species NOT DETECTED NOT DETECTED   Streptococcus agalactiae NOT DETECTED NOT DETECTED   Streptococcus pneumoniae NOT DETECTED NOT DETECTED   Streptococcus pyogenes NOT DETECTED NOT DETECTED   Acinetobacter baumannii NOT DETECTED NOT DETECTED   Enterobacteriaceae species DETECTED (A) NOT DETECTED   Enterobacter cloacae complex NOT DETECTED NOT DETECTED   Escherichia coli DETECTED (A) NOT DETECTED   Klebsiella oxytoca NOT DETECTED NOT DETECTED   Klebsiella pneumoniae NOT DETECTED NOT DETECTED   Proteus species NOT DETECTED NOT DETECTED   Serratia marcescens NOT DETECTED NOT DETECTED   Carbapenem resistance NOT DETECTED NOT DETECTED   Haemophilus influenzae NOT DETECTED NOT DETECTED   Neisseria meningitidis NOT DETECTED NOT DETECTED   Pseudomonas aeruginosa NOT DETECTED NOT DETECTED   Candida albicans NOT DETECTED NOT DETECTED   Candida glabrata NOT DETECTED NOT DETECTED   Candida krusei NOT DETECTED NOT DETECTED   Candida  parapsilosis NOT DETECTED NOT DETECTED   Candida tropicalis NOT DETECTED NOT DETECTED    Gardner CandleSheema M Genene Kilman, PharmD, BCPS Clinical Pharmacist 12/27/2017 9:38 AM

## 2017-12-28 LAB — URINE CULTURE

## 2017-12-28 LAB — HIV ANTIBODY (ROUTINE TESTING W REFLEX): HIV Screen 4th Generation wRfx: NONREACTIVE

## 2017-12-28 MED ORDER — CEPHALEXIN 500 MG PO CAPS: 500.0000 mg | ORAL_CAPSULE | Freq: Three times a day (TID) | ORAL | 0 refills | Status: AC

## 2017-12-28 MED ORDER — OXYCODONE HCL 5 MG PO TABS: 5.0000 mg | ORAL_TABLET | Freq: Three times a day (TID) | ORAL | 0 refills | Status: DC | PRN

## 2017-12-28 MED ORDER — CEPHALEXIN 500 MG PO CAPS
500.0000 mg | ORAL_CAPSULE | Freq: Three times a day (TID) | ORAL | Status: DC
  Administered 2017-12-28: 500 mg via ORAL
  Filled 2017-12-28: qty 1

## 2017-12-28 NOTE — Discharge Summary (Signed)
[facility] [place] at [facility]   PATIENT NAME: Leslie Kerr    MR#:  [id number]  DATE OF BIRTH:  [birth year]  DATE OF ADMISSION:  12/26/2017 ADMITTING PHYSICIAN: Milagros Loll, MD  DATE OF DISCHARGE: 12/28/2017  PRIMARY CARE PHYSICIAN: Patient, No Pcp Per    ADMISSION DIAGNOSIS:  Pyelonephritis [N12]  DISCHARGE DIAGNOSIS:  E. coli sepsis Acute right sided pyelonephritis E. coli UTI SECONDARY DIAGNOSIS:  History reviewed. No pertinent past medical history.  HOSPITAL COURSE:   [name] a22 y.o.femalewith no significant past medical history presents to the hospital complaining of 1 day of right flank pain, fever. Patient here was found to be tachycardic with a fever of 103. CT scan of the abdomen and pelvis showed right pyelonephritis. UTI on urinalysis.  *Acute right pyelonephritis with sepsis present on admission -Received IV fluids -ContinueIV ceftriaxone.---Changed to p.o. Keflex 500 3 times daily for total 10 days -Urine more than 100,000 colonies of gram-negative rods and blood cultures 1 out of 2 E. coli -Currently afebrile improving -E. coli pan sensitive -Hemodynamically stable overall feeling better  *Leukocytosis secondary to acute infection  *DVT prophylaxis with Lovenox  Will discharge to home.  Patient advised to drink adequate fluids complete antibiotic course and get primary care physician in the area.  If any signs symptoms recur return to urgent care and/or emergency room CONSULTS OBTAINED:    DRUG ALLERGIES:  No Known Allergies  DISCHARGE MEDICATIONS:   Allergies as of 12/28/2017   No Known Allergies     Medication List    TAKE these medications   cephALEXin 500 MG capsule Commonly known as:  KEFLEX Take 1 capsule (500 mg total) by mouth 3 (three) times daily for 8 days.   oxyCODONE 5 MG immediate release tablet Commonly known as:  Oxy IR/ROXICODONE Take 1 tablet (5 mg total) by mouth every 8  (eight) hours as needed for moderate pain.       If you experience worsening of your admission symptoms, develop shortness of breath, life threatening emergency, suicidal or homicidal thoughts you must seek medical attention immediately by calling 911 or calling your MD immediately  if symptoms less severe.  You Must read complete instructions/literature along with all the possible adverse reactions/side effects for all the Medicines you take and that have been prescribed to you. Take any new Medicines after you have completely understood and accept all the possible adverse reactions/side effects.   Please note  You were cared for by a hospitalist during your hospital stay. If you have any questions about your discharge medications or the care you received while you were in the hospital after you are discharged, you can call the unit and asked to speak with the hospitalist on call if the hospitalist that took care of you is not available. Once you are discharged, your primary care physician will handle any further medical issues. Please note that NO REFILLS for any discharge medications will be authorized once you are discharged, as it is imperative that you return to your primary care physician (or establish a relationship with a primary care physician if you do not have one) for your aftercare needs so that they can reassess your need for medications and monitor your lab values. Today   SUBJECTIVE   Right flank pain--mild  VITAL SIGNS:  Blood pressure 124/75, pulse 92, temperature 99 F (37.2 C), temperature source Oral, resp. rate 18, height 5\' 3"  (1.6 m), weight 101.6 kg (224 lb), last menstrual period  12/25/2017, SpO2 97 %.  I/O:    Intake/Output Summary (Last 24 hours) at 12/28/2017 1046 Last data filed at 12/28/2017 1024 Gross per 24 hour  Intake 1141 ml  Output 3 ml  Net 1138 ml    PHYSICAL EXAMINATION:  GENERAL:  22 y.o.-year-old patient lying in the bed with no acute  distress.  EYES: Pupils equal, round, reactive to light and accommodation. No scleral icterus. Extraocular muscles intact.  HEENT: Head atraumatic, normocephalic. Oropharynx and nasopharynx clear.  NECK:  Supple, no jugular venous distention. No thyroid enlargement, no tenderness.  LUNGS: Normal breath sounds bilaterally, no wheezing, rales,rhonchi or crepitation. No use of accessory muscles of respiration.  CARDIOVASCULAR: S1, S2 normal. No murmurs, rubs, or gallops.  ABDOMEN: Soft, non-tender, non-distended. Bowel sounds present. No organomegaly or mass.  EXTREMITIES: No pedal edema, cyanosis, or clubbing.  NEUROLOGIC: Cranial nerves II through XII are intact. Muscle strength 5/5 in all extremities. Sensation intact. Gait not checked.  PSYCHIATRIC: The patient is alert and oriented x 3.  SKIN: No obvious rash, lesion, or ulcer.   DATA REVIEW:   CBC  Recent Labs  Lab 12/27/17 0534  WBC 13.4*  HGB 12.0  HCT 37.3  PLT 215    Chemistries  Recent Labs  Lab 12/26/17 1251 12/27/17 0534  NA 139 140  K 3.7 3.9  CL 105 111  CO2 23 20*  GLUCOSE 112* 95  BUN 9 7  CREATININE 0.69 0.68  CALCIUM 9.2 8.4*  AST 17  --   ALT 20  --   ALKPHOS 77  --   BILITOT 1.3*  --     Microbiology Results   Recent Results (from the past 240 hour(s))  Urine Culture     Status: Abnormal   Collection Time: 12/26/17 12:51 PM  Result Value Ref Range Status   Specimen Description   Final    URINE, RANDOM Performed at [facility], [address] [place], [place] [address]    Special Requests   Final    NONE Performed at [facility], [address] [place], [place] [address]    Culture >=100,000 COLONIES/mL ESCHERICHIA COLI (A)  Final   Report Status 12/28/2017 FINAL  Final   Organism ID, Bacteria ESCHERICHIA COLI (A)  Final      Susceptibility   Escherichia coli - MIC*    AMPICILLIN >=32 RESISTANT Resistant     CEFAZOLIN <=4 SENSITIVE Sensitive     CEFTRIAXONE <=1  SENSITIVE Sensitive     CIPROFLOXACIN <=0.25 SENSITIVE Sensitive     GENTAMICIN <=1 SENSITIVE Sensitive     IMIPENEM <=0.25 SENSITIVE Sensitive     NITROFURANTOIN 32 SENSITIVE Sensitive     TRIMETH/SULFA >=320 RESISTANT Resistant     AMPICILLIN/SULBACTAM 16 INTERMEDIATE Intermediate     PIP/TAZO <=4 SENSITIVE Sensitive     Extended ESBL NEGATIVE Sensitive     * >=100,000 COLONIES/mL ESCHERICHIA COLI  Blood Culture (routine x 2)     Status: None (Preliminary result)   Collection Time: 12/26/17  6:22 PM  Result Value Ref Range Status   Specimen Description BLOOD RIGHT ANTECUBITAL  Final   Special Requests   Final    BOTTLES DRAWN AEROBIC AND ANAEROBIC Blood Culture adequate volume   Culture   Final    NO GROWTH 2 DAYS Performed at [facility], [address] [place], [place] [address]    Report Status PENDING  Incomplete  Blood Culture (routine x 2)     Status:  Abnormal (Preliminary result)   Collection Time: 12/26/17  6:22 PM  Result Value Ref Range Status   Specimen Description   Final    BLOOD LEFT ANTECUBITAL Performed at [facility], [address] [place], [place] [address]    Special Requests   Final    BOTTLES DRAWN AEROBIC AND ANAEROBIC Blood Culture adequate volume Performed at [facility], [address] [place], [place] [address]    Culture  Setup Time   Final    GRAM NEGATIVE RODS ANAEROBIC BOTTLE ONLY CRITICAL RESULT CALLED TO, READ BACK BY AND VERIFIED WITH: SHEEMA HALLAJI 12/27/17 @ 0848  MLK    Culture (A)  Final    ESCHERICHIA COLI SUSCEPTIBILITIES TO FOLLOW Performed at [facility], [address] [place], [place] [address]    Report Status PENDING  Incomplete  Blood Culture ID Panel (Reflexed)     Status: Abnormal   Collection Time: 12/26/17  6:22 PM  Result Value Ref Range Status   Enterococcus species NOT DETECTED NOT DETECTED Final   Listeria monocytogenes NOT DETECTED NOT DETECTED Final   Staphylococcus  species NOT DETECTED NOT DETECTED Final   Staphylococcus aureus NOT DETECTED NOT DETECTED Final   Streptococcus species NOT DETECTED NOT DETECTED Final   Streptococcus agalactiae NOT DETECTED NOT DETECTED Final   Streptococcus pneumoniae NOT DETECTED NOT DETECTED Final   Streptococcus pyogenes NOT DETECTED NOT DETECTED Final   Acinetobacter baumannii NOT DETECTED NOT DETECTED Final   Enterobacteriaceae species DETECTED (A) NOT DETECTED Final    Comment: Enterobacteriaceae represent a large family of gram-negative bacteria, not a single organism. CRITICAL RESULT CALLED TO, READ BACK BY AND VERIFIED WITH: SHEEMA HALLAJI 12/27/17 @ 0848 MLK    Enterobacter cloacae complex NOT DETECTED NOT DETECTED Final   Escherichia coli DETECTED (A) NOT DETECTED Final    Comment: CRITICAL RESULT CALLED TO, READ BACK BY AND VERIFIED WITH: SHEEMA HALLAJI 12/27/17 @ 0848  MLK    Klebsiella oxytoca NOT DETECTED NOT DETECTED Final   Klebsiella pneumoniae NOT DETECTED NOT DETECTED Final   Proteus species NOT DETECTED NOT DETECTED Final   Serratia marcescens NOT DETECTED NOT DETECTED Final   Carbapenem resistance NOT DETECTED NOT DETECTED Final   Haemophilus influenzae NOT DETECTED NOT DETECTED Final   Neisseria meningitidis NOT DETECTED NOT DETECTED Final   Pseudomonas aeruginosa NOT DETECTED NOT DETECTED Final   Candida albicans NOT DETECTED NOT DETECTED Final   Candida glabrata NOT DETECTED NOT DETECTED Final   Candida krusei NOT DETECTED NOT DETECTED Final   Candida parapsilosis NOT DETECTED NOT DETECTED Final   Candida tropicalis NOT DETECTED NOT DETECTED Final    Comment: Performed at [facility], [address] [place], [place] [address]    RADIOLOGY:  [place] Transvaginal Non-ob  Result Date: 12/26/2017 CLINICAL DATA:  Right-sided pelvic pain EXAM: TRANSABDOMINAL AND TRANSVAGINAL ULTRASOUND OF PELVIS DOPPLER ULTRASOUND OF OVARIES TECHNIQUE: Study was performed transabdominally to optimize  pelvic field of view evaluation and transvaginally to optimize internal visceral architecture evaluation. Color and duplex Doppler ultrasound was utilized to evaluate blood flow to the ovaries. COMPARISON:  September 01, 2017 FINDINGS: Uterus Measurements: 6.6 x 2.9 x 3.8 cm. No fibroids or other mass visualized. Endometrium Thickness: 3 mm.  No focal abnormality visualized. Right ovary Measurements: 4.8 x 2.4 x 1.8 cm. Normal appearance/no adnexal mass. Left ovary Measurements: 3.1 x 1.2 x 1.8 cm. Normal appearance/no adnexal mass. Pulsed Doppler evaluation of both ovaries demonstrates normal low-resistance arterial and  venous waveforms. Other findings No abnormal free fluid. IMPRESSION: No intrauterine or extrauterine pelvic or adnexal mass. No inflammatory focus. No ovarian torsion on either side. Electronically Signed   By: Bretta Bang III M.D.   On: 12/26/2017 14:59   US Pelvis Complete  Result Date: 12/26/2017 CLINICAL DATA:  Right-sided pelvic pain EXAM: TRANSABDOMINAL AND TRANSVAGINAL ULTRASOUND OF PELVIS DOPPLER ULTRASOUND OF OVARIES TECHNIQUE: Study was performed transabdominally to optimize pelvic field of view evaluation and transvaginally to optimize internal visceral architecture evaluation. Color and duplex Doppler ultrasound was utilized to evaluate blood flow to the ovaries. COMPARISON:  September 01, 2017 FINDINGS: Uterus Measurements: 6.6 x 2.9 x 3.8 cm. No fibroids or other mass visualized. Endometrium Thickness: 3 mm.  No focal abnormality visualized. Right ovary Measurements: 4.8 x 2.4 x 1.8 cm. Normal appearance/no adnexal mass. Left ovary Measurements: 3.1 x 1.2 x 1.8 cm. Normal appearance/no adnexal mass. Pulsed Doppler evaluation of both ovaries demonstrates normal low-resistance arterial and venous waveforms. Other findings No abnormal free fluid. IMPRESSION: No intrauterine or extrauterine pelvic or adnexal mass. No inflammatory focus. No ovarian torsion on either side.  Electronically Signed   By: Bretta Bang III M.D.   On: 12/26/2017 14:59   Ct Abdomen Pelvis W Contrast  Result Date: 12/26/2017 CLINICAL DATA:  Abdominal pain. Right lower quadrant pain beginning last evening. Nausea and vomiting. EXAM: CT ABDOMEN AND PELVIS WITH CONTRAST TECHNIQUE: Multidetector CT imaging of the abdomen and pelvis was performed using the standard protocol following bolus administration of intravenous contrast. CONTRAST:  ISOVUE-300 IOPAMIDOL (ISOVUE-300) INJECTION 61% COMPARISON:  Pelvic ultrasound from the same day. CT of the abdomen pelvis 09/01/2014. FINDINGS: Lower chest: The lung bases are clear without focal nodule, mass, or airspace disease. The heart size is normal. No significant pleural or pericardial effusion is present. Hepatobiliary: No focal liver abnormality is seen. No gallstones, gallbladder wall thickening, or biliary dilatation. Pancreas: Unremarkable. No pancreatic ductal dilatation or surrounding inflammatory changes. Spleen: Normal in size without focal abnormality. Adrenals/Urinary Tract: Adrenal glands are normal bilaterally. The left kidney in ureter are within normal limits. There is slight heterogeneity of the right kidney. There is enhancement of the mucosa in the right renal collecting system and ureter. No obstructing mass lesion is present. The urinary bladder is within normal limits. Stomach/Bowel: The stomach is mildly distended without obstruction. The duodenum is normal. Small bowel is within normal limits. The terminal ileum is visualized and normal. The appendix is within normal limits. The ascending and transverse colon are. The descending and sigmoid colon are unremarkable. Vascular/Lymphatic: No significant vascular findings are present. No enlarged abdominal or pelvic lymph nodes. Reproductive: Uterus and bilateral adnexa are unremarkable. Other: No abdominal wall hernia or abnormality. No abdominopelvic ascites. Musculoskeletal: Vertebral  body heights and alignment are normal. No focal lytic or blastic lesions are evident. IMPRESSION: 1. Subtle heterogeneous enhancement of the right kidney inflammatory changes as well as inflammatory changes about the right ureter raising concern for pyelonephritis in the setting of a urinary tract infection. 2. The left kidney is unremarkable. 3. Normal appearance of the appendix. Electronically Signed   By: Marin Roberts M.D.   On: 12/26/2017 16:08   [place] Art/ven Flow Abd Pelv Doppler  Result Date: 12/26/2017 CLINICAL DATA:  Right-sided pelvic pain EXAM: TRANSABDOMINAL AND TRANSVAGINAL ULTRASOUND OF PELVIS DOPPLER ULTRASOUND OF OVARIES TECHNIQUE: Study was performed transabdominally to optimize pelvic field of view evaluation and transvaginally to optimize internal visceral architecture evaluation. Color and duplex Doppler ultrasound  was utilized to evaluate blood flow to the ovaries. COMPARISON:  September 01, 2017 FINDINGS: Uterus Measurements: 6.6 x 2.9 x 3.8 cm. No fibroids or other mass visualized. Endometrium Thickness: 3 mm.  No focal abnormality visualized. Right ovary Measurements: 4.8 x 2.4 x 1.8 cm. Normal appearance/no adnexal mass. Left ovary Measurements: 3.1 x 1.2 x 1.8 cm. Normal appearance/no adnexal mass. Pulsed Doppler evaluation of both ovaries demonstrates normal low-resistance arterial and venous waveforms. Other findings No abnormal free fluid. IMPRESSION: No intrauterine or extrauterine pelvic or adnexal mass. No inflammatory focus. No ovarian torsion on either side. Electronically Signed   By: Bretta Bang III M.D.   On: 12/26/2017 14:59     Management plans discussed with the patient, family and they are in agreement.  CODE STATUS:     Code Status Orders  (From admission, onward)        Start     Ordered   12/26/17 1826  Full code  Continuous     12/26/17 1826    Code Status History    Date Active Date Inactive Code Status Order ID Comments User Context    This patient has a current code status but no historical code status.      TOTAL TIME TAKING CARE OF THIS PATIENT: *40* minutes.    Enedina Finner M.D on 12/28/2017 at 10:46 AM  Between 7am to 6pm - Pager - [phone] After 6pm go to [email or web address] - password [email or web address]  [facility]  Office  [phone]  CC: Primary care physician; Patient, No Pcp Per

## 2017-12-28 NOTE — Progress Notes (Signed)
Discharge instructions complete and prescriptions sent to pharmacy by provider. Patient verbalizes understanding of teaching. Patient discharged home via wheelchair at 11:45.

## 2017-12-29 LAB — CULTURE, BLOOD (ROUTINE X 2): Special Requests: ADEQUATE

## 2017-12-31 LAB — CULTURE, BLOOD (ROUTINE X 2)
CULTURE: NO GROWTH
SPECIAL REQUESTS: ADEQUATE

## 2018-01-12 ENCOUNTER — Other Ambulatory Visit: Payer: Self-pay

## 2018-01-12 ENCOUNTER — Encounter: Payer: Self-pay | Admitting: Emergency Medicine

## 2018-01-12 ENCOUNTER — Emergency Department: Payer: Managed Care, Other (non HMO)

## 2018-01-12 DIAGNOSIS — R42 Dizziness and giddiness: Secondary | ICD-10-CM | POA: Diagnosis present

## 2018-01-12 DIAGNOSIS — E860 Dehydration: Secondary | ICD-10-CM | POA: Diagnosis not present

## 2018-01-12 LAB — BASIC METABOLIC PANEL
ANION GAP: 7 (ref 5–15)
BUN: 8 mg/dL (ref 6–20)
CHLORIDE: 104 mmol/L (ref 101–111)
CO2: 25 mmol/L (ref 22–32)
Calcium: 9.2 mg/dL (ref 8.9–10.3)
Creatinine, Ser: 0.67 mg/dL (ref 0.44–1.00)
GFR calc Af Amer: >60 mL/min (ref >60)
GFR calc non Af Amer: >60 mL/min (ref >60)
GLUCOSE: 104 mg/dL — AB (ref 65–99)
POTASSIUM: 3.6 mmol/L (ref 3.5–5.1)
SODIUM: 136 mmol/L (ref 135–145)

## 2018-01-12 LAB — CBC
HCT: 40.9 % (ref 35.0–47.0)
HEMOGLOBIN: 13.3 g/dL (ref 12.0–16.0)
MCH: 26.1 pg (ref 26.0–34.0)
MCHC: 32.6 g/dL (ref 32.0–36.0)
MCV: 80.1 fL (ref 80.0–100.0)
Platelets: 272 10*3/uL (ref 150–440)
RBC: 5.1 MIL/uL (ref 3.80–5.20)
RDW: 14.7 % — AB (ref 11.5–14.5)
WBC: 9.3 10*3/uL (ref 3.6–11.0)

## 2018-01-12 LAB — TROPONIN I: Troponin I: <0.03 ng/mL (ref <0.03)

## 2018-01-12 NOTE — ED Triage Notes (Signed)
Pt reports that she is having chest pain when she takes a deep breath. Pt also reports that she is having lower back pain, and has not been able to pee since noon today. She was seen here last week for a kidney infection.

## 2018-01-13 ENCOUNTER — Emergency Department
Admission: EM | Admit: 2018-01-13 | Discharge: 2018-01-13 | Disposition: A | Discharge status: Home / Self Care | Payer: Managed Care, Other (non HMO) | Attending: Emergency Medicine | Admitting: Emergency Medicine

## 2018-01-13 DIAGNOSIS — R42 Dizziness and giddiness: Secondary | ICD-10-CM

## 2018-01-13 DIAGNOSIS — R079 Chest pain, unspecified: Secondary | ICD-10-CM

## 2018-01-13 DIAGNOSIS — E860 Dehydration: Secondary | ICD-10-CM

## 2018-01-13 HISTORY — DX: Renal tubulo-interstitial disease, unspecified: N15.9

## 2018-01-13 LAB — URINALYSIS, COMPLETE (UACMP) WITH MICROSCOPIC
BACTERIA UA: NONE SEEN
BILIRUBIN URINE: NEGATIVE
Glucose, UA: NEGATIVE mg/dL
HGB URINE DIPSTICK: NEGATIVE
Ketones, ur: NEGATIVE mg/dL
LEUKOCYTES UA: NEGATIVE
NITRITE: NEGATIVE
PROTEIN: NEGATIVE mg/dL
SQUAMOUS EPITHELIAL / LPF: NONE SEEN
Specific Gravity, Urine: 1.02 (ref 1.005–1.030)
pH: 5 (ref 5.0–8.0)

## 2018-01-13 LAB — PREGNANCY, URINE: PREG TEST UR: NEGATIVE

## 2018-01-13 LAB — TROPONIN I: Troponin I: <0.03 ng/mL (ref <0.03)

## 2018-01-13 MED ORDER — SODIUM CHLORIDE 0.9 % IV BOLUS
1000.0000 mL | Freq: Once | INTRAVENOUS | Status: AC
  Administered 2018-01-13: 1000 mL via INTRAVENOUS

## 2018-01-13 NOTE — ED Notes (Signed)
Pt alert and oriented X4, active, cooperative, pt in NAD. RR even and unlabored, color WNL.  Pt informed to return if any life threatening symptoms occur.  Discharge and followup instructions reviewed.  

## 2018-01-13 NOTE — ED Provider Notes (Signed)
[facility] Emergency Department Provider Note   ____________________________________________   First MD Initiated Contact with Patient 01/13/18 [phone]     (approximate)  I have reviewed the triage vital signs and the nursing notes.   HISTORY  Chief Complaint Back Pain and Chest Pain    HPI Leslie Kerr is a 22 y.o. female who comes into the hospital today stating that she is unable to urinate.  She reports that she last urinated around noon.  She is having some lower back pain and some sharp chest pain that comes and goes.  The patient states that she felt lightheaded and felt like the room was spinning.  She was treated for a kidney infection a couple of weeks ago and is unsure if that may be the cause of her symptoms.  The patient states that she has been drinking water but she still has not had the urge to urinate.  The patient has not had any fevers but has had some nausea.  She denies any vomiting.  She states that when she feels lightheaded she gets some shortness of breath.  She denies any pain presently and she denies any radiation of her pain when it comes.  The patient states that nothing brings on the chest pain.  She denies any worsening pain with any deep breathing.  Chest x-ray: No active cardiopulmonary disease she is here today for evaluation.  Past Medical History:  Diagnosis Date  . Kidney infection     Patient Active Problem List   Diagnosis Date Noted  . Lightheadedness 01/13/2018  . Pyelonephritis 12/26/2017    History reviewed. No pertinent surgical history.  Prior to Admission medications   Medication Sig Start Date End Date Taking? Authorizing Provider  oxyCODONE (OXY IR/ROXICODONE) 5 MG immediate release tablet Take 1 tablet (5 mg total) by mouth every 8 (eight) hours as needed for moderate pain. 12/28/17   Enedina Finner, MD    Allergies Patient has no known allergies.  History reviewed. No pertinent family history.  Social  History Social History   Tobacco Use  . Smoking status: Never Smoker  . Smokeless tobacco: Never Used  Substance Use Topics  . Alcohol use: No    Frequency: Never  . Drug use: No    Review of Systems  Constitutional: No fever/chills Eyes: No visual changes. ENT: No sore throat. Cardiovascular: Denies chest pain. Respiratory: Denies shortness of breath. Gastrointestinal: No abdominal pain.  No nausea, no vomiting.  No diarrhea.  No constipation. Genitourinary: Negative for dysuria. Musculoskeletal: Negative for back pain. Skin: Negative for rash. Neurological: Negative for headaches, focal weakness or numbness.   ____________________________________________   PHYSICAL EXAM:  VITAL SIGNS: ED Triage Vitals  Enc Vitals Group     BP 01/12/18 2239 (!) 145/108     Pulse Rate 01/12/18 2239 (!) 121     Resp 01/12/18 2239 (!) 22     Temp 01/12/18 2239 99 F (37.2 C)     Temp Source 01/12/18 2239 Oral     SpO2 01/12/18 2239 95 %     Weight 01/12/18 2240 224 lb (101.6 kg)     Height 01/12/18 2240 5\' 3"  (1.6 m)     Head Circumference --      Peak Flow --      Pain Score 01/12/18 2240 8     Pain Loc --      Pain Edu? --      Excl. in GC? --  Constitutional: Alert and oriented. Well appearing and in mild distress. Eyes: Conjunctivae are normal. PERRL. EOMI. Head: Atraumatic. Nose: No congestion/rhinnorhea. Mouth/Throat: Mucous membranes are moist.  Oropharynx non-erythematous. Cardiovascular: Normal rate, regular rhythm. Grossly normal heart sounds.  Good peripheral circulation. Respiratory: Normal respiratory effort.  No retractions. Lungs CTAB. Gastrointestinal: Soft and nontender. No distention.  Positive bowel sounds Musculoskeletal: No lower extremity tenderness nor edema.   Neurologic:  Normal speech and language.  Cranial nerves II through XII are grossly intact with no focal motor neuro deficit Skin:  Skin is warm, dry and intact. Psychiatric: Mood and affect  are normal.   ____________________________________________   LABS (all labs ordered are listed, but only abnormal results are displayed)  Labs Reviewed  BASIC METABOLIC PANEL - Abnormal; Notable for the following components:      Result Value   Glucose, Bld 104 (*)    All other components within normal limits  CBC - Abnormal; Notable for the following components:   RDW 14.7 (*)    All other components within normal limits  URINALYSIS, COMPLETE (UACMP) WITH MICROSCOPIC - Abnormal; Notable for the following components:   Color, Urine YELLOW (*)    APPearance HAZY (*)    All other components within normal limits  TROPONIN I  TROPONIN I  PREGNANCY, URINE  POC URINE PREG, ED   ____________________________________________  EKG  ED ECG REPORT I, Rebecka Apley, the attending physician, personally viewed and interpreted this ECG.   Date: 01/12/2018  EKG Time: 2241  Rate: 117  Rhythm: sinus tachycardia  Axis: normal  Intervals:none  ST&T Change: none  ____________________________________________  RADIOLOGY  ED MD interpretation: Chest x-ray: No active cardiopulmonary disease  Official radiology report(s): Dg Chest 2 View  Result Date: 01/12/2018 CLINICAL DATA:  Chest pain EXAM: CHEST - 2 VIEW COMPARISON:  None. FINDINGS: The heart size and mediastinal contours are within normal limits. Both lungs are clear. The visualized skeletal structures are unremarkable. IMPRESSION: No active cardiopulmonary disease. Electronically Signed   By: Jasmine Pang M.D.   On: 01/12/2018 23:00    ____________________________________________   PROCEDURES  Procedure(s) performed: None  Procedures  Critical Care performed: No  ____________________________________________   INITIAL IMPRESSION / ASSESSMENT AND PLAN / ED COURSE  As part of my medical decision making, I reviewed the following data within the electronic MEDICAL RECORD NUMBER Notes from prior ED visits and [place] Controlled  Substance Database   This is a 22 year old female who comes into the hospital today stating that she has been unable to urinate.  We did perform a bladder scan and the patient only had 200 mL's of urine in her bladder.    My differential diagnosis includes urinary tract infection, dehydration, renal failure.  We did check some blood work on the patient to include a CBC, BMP, urinalysis, troponin and pregnancy.  The patient's blood work was unremarkable.  I did repeat the patient's troponin and it was also unremarkable.  I gave the patient a liter of normal saline and her tachycardia improved.  Her urine again did not show any signs of infection but some of her lightheadedness improved.  We also gave the patient some food that she has been here for quite some time without eating.  The patient was able to urinate twice in the emergency department after receiving a liter of normal saline.  As her workup is unremarkable she will be discharged home and encouraged to follow-up with her primary care physician.  ____________________________________________   FINAL CLINICAL IMPRESSION(S) / ED DIAGNOSES  Final diagnoses:  Dehydration  Lightheadedness  Chest pain, unspecified type     ED Discharge Orders    None       Note:  This document was prepared using Dragon voice recognition software and may include unintentional dictation errors.    Rebecka ApleyWebster, Allison P, MD 01/13/18 [phone]

## 2018-01-13 NOTE — Discharge Instructions (Signed)
Please follow-up with the acute care clinic for further evaluation of your symptoms.  Please return with any other concerns or any other conditions.

## 2018-01-15 ENCOUNTER — Other Ambulatory Visit
Admission: RE | Admit: 2018-01-15 | Discharge: 2018-01-15 | Disposition: A | Discharge status: Home / Self Care | Payer: Managed Care, Other (non HMO) | Source: Ambulatory Visit | Attending: Pediatrics | Admitting: Pediatrics

## 2018-01-15 DIAGNOSIS — R0789 Other chest pain: Secondary | ICD-10-CM | POA: Insufficient documentation

## 2018-01-15 LAB — FIBRIN DERIVATIVES D-DIMER (ARMC ONLY): Fibrin derivatives D-dimer (ARMC): 325.84 ng/mL (FEU) (ref 0.00–499.00)

## 2018-10-15 ENCOUNTER — Other Ambulatory Visit: Payer: Self-pay

## 2018-10-15 ENCOUNTER — Emergency Department
Admission: EM | Admit: 2018-10-15 | Discharge: 2018-10-15 | Disposition: A | Discharge status: Home / Self Care | Payer: Self-pay | Attending: Emergency Medicine | Admitting: Emergency Medicine

## 2018-10-15 ENCOUNTER — Encounter: Payer: Self-pay | Admitting: Emergency Medicine

## 2018-10-15 ENCOUNTER — Emergency Department: Payer: Self-pay

## 2018-10-15 DIAGNOSIS — J020 Streptococcal pharyngitis: Secondary | ICD-10-CM | POA: Insufficient documentation

## 2018-10-15 MED ORDER — MAGIC MOUTHWASH W/LIDOCAINE: 5.0000 mL | Freq: Four times a day (QID) | ORAL | 0 refills | Status: DC

## 2018-10-15 MED ORDER — CEFDINIR 300 MG PO CAPS
300.0000 mg | ORAL_CAPSULE | Freq: Once | ORAL | Status: AC
  Administered 2018-10-15: 300 mg via ORAL
  Filled 2018-10-15: qty 1

## 2018-10-15 MED ORDER — CEFDINIR 300 MG PO CAPS: 300.0000 mg | ORAL_CAPSULE | Freq: Two times a day (BID) | ORAL | 0 refills | Status: DC

## 2018-10-15 NOTE — ED Provider Notes (Signed)
[facility] Emergency Department Provider Note  ____________________________________________  Time seen: Approximately 9:23 PM  I have reviewed the triage vital signs and the nursing notes.   HISTORY  Chief Complaint Cough    HPI Leslie Kerr is a 23 y.o. female who presents the emergency department complaining of sore throat, cough.  Patient reports that this is the eighth time she has had similar symptoms.  Patient has been tested for strep on previous infections and diagnosed with strep several times in the past year.  Patient reports that before this she had no difficulties with strep.  She has not seen ENT or had a discussion about tonsillectomy.  Patient has been on amoxicillin which seems to improve the infection for a month or so and then it typically returns.  Patient denies any headache, visual changes, neck pain or stiffness, shortness of breath, abdominal pain, nausea or vomiting.  Last diagnosed drip infection was approximately 6 weeks ago.    Past Medical History:  Diagnosis Date  . Kidney infection     Patient Active Problem List   Diagnosis Date Noted  . Lightheadedness 01/13/2018  . Pyelonephritis 12/26/2017    History reviewed. No pertinent surgical history.  Prior to Admission medications   Medication Sig Start Date End Date Taking? Authorizing Provider  cefdinir (OMNICEF) 300 MG capsule Take 1 capsule (300 mg total) by mouth 2 (two) times daily. 10/15/18   Asaiah Scarber, Delorise RoyalsJonathan D, PA-C  magic mouthwash w/lidocaine SOLN Take 5 mLs by mouth 4 (four) times daily. 10/15/18   Denilson Salminen, Delorise RoyalsJonathan D, PA-C  oxyCODONE (OXY IR/ROXICODONE) 5 MG immediate release tablet Take 1 tablet (5 mg total) by mouth every 8 (eight) hours as needed for moderate pain. 12/28/17   Enedina FinnerPatel, Sona, MD    Allergies Patient has no known allergies.  No family history on file.  Social History Social History   Tobacco Use  . Smoking status: Never Smoker  . Smokeless  tobacco: Never Used  Substance Use Topics  . Alcohol use: No    Frequency: Never  . Drug use: No     Review of Systems  Constitutional: No fever/chills Eyes: No visual changes. No discharge ENT: Positive for sore throat Cardiovascular: no chest pain. Respiratory: Positive cough. No SOB. Gastrointestinal: No abdominal pain.  No nausea, no vomiting.   Musculoskeletal: Negative for musculoskeletal pain. Skin: Negative for rash, abrasions, lacerations, ecchymosis. Neurological: Negative for headaches, focal weakness or numbness. 10-point ROS otherwise negative.  ____________________________________________   PHYSICAL EXAM:  VITAL SIGNS: ED Triage Vitals  Enc Vitals Group     BP 10/15/18 1946 135/71     Pulse --      Resp 10/15/18 1946 18     Temp 10/15/18 1946 98.4 F (36.9 C)     Temp Source 10/15/18 1946 Oral     SpO2 10/15/18 1946 99 %     Weight 10/15/18 1945 215 lb (97.5 kg)     Height 10/15/18 1945 5\' 5"  (1.651 m)     Head Circumference --      Peak Flow --      Pain Score 10/15/18 1945 8     Pain Loc --      Pain Edu? --      Excl. in GC? --      Constitutional: Alert and oriented. Well appearing and in no acute distress. Eyes: Conjunctivae are normal. PERRL. EOMI. Head: Atraumatic. ENT:      Ears: EACs and TMs unremarkable bilaterally  Nose: No congestion/rhinnorhea.      Mouth/Throat: Mucous membranes are moist.  Tonsils are edematous, erythematous, exudates bilaterally.  Uvula is midline  neck: No stridor.  Neck is supple full range of motion Hematological/Lymphatic/Immunilogical: Diffuse, mobile, tender anterior cervical lymphadenopathy. Cardiovascular: Normal rate, regular rhythm. Normal S1 and S2.  Good peripheral circulation. Respiratory: Normal respiratory effort without tachypnea or retractions. Lungs CTAB. Good air entry to the bases with no decreased or absent breath sounds. Musculoskeletal: Full range of motion to all extremities. No gross  deformities appreciated. Neurologic:  Normal speech and language. No gross focal neurologic deficits are appreciated.  Skin:  Skin is warm, dry and intact. No rash noted. Psychiatric: Mood and affect are normal. Speech and behavior are normal. Patient exhibits appropriate insight and judgement.   ____________________________________________   LABS (all labs ordered are listed, but only abnormal results are displayed)  Labs Reviewed - No data to display ____________________________________________  EKG   ____________________________________________  RADIOLOGY I personally viewed and evaluated these images as part of my medical decision making, as well as reviewing the written report by the radiologist.  I concur with radiologist of no findings consistent with pneumonia  Dg Chest 2 View  Result Date: 10/15/2018 CLINICAL DATA:  Sore throat and productive cough. EXAM: CHEST - 2 VIEW COMPARISON:  Chest x-ray dated January 12, 2018. FINDINGS: The heart size and mediastinal contours are within normal limits. Both lungs are clear. The visualized skeletal structures are unremarkable. IMPRESSION: No active cardiopulmonary disease. Electronically Signed   By: Obie Dredge M.D.   On: 10/15/2018 21:12    ____________________________________________    PROCEDURES  Procedure(s) performed:    Procedures    Medications  cefdinir (OMNICEF) capsule 300 mg (has no administration in time range)     ____________________________________________   INITIAL IMPRESSION / ASSESSMENT AND PLAN / ED COURSE  Pertinent labs & imaging results that were available during my care of the patient were reviewed by me and considered in my medical decision making (see chart for details).  Review of the [place] CSRS was performed in accordance of the [facility] prior to dispensing any controlled drugs.      Patient's diagnosis is consistent with strep pharyngitis.  Patient presents emergency department complaining  of sore throat, cough.  On exam, patient has findings consistent with strep.  This will be the patient's eighth strep infection within a year.  I have recommended follow-up with ENT for tonsillectomy discussion.  Patient has been on amoxicillin every single previous strep infection.  I will try Omnicef at this time.  Patient will be prescribed Omnicef and Magic mouthwash for symptom relief.  Tylenol Motrin at home for pain.  Follow-up with ENT..  Patient is given ED precautions to return to the ED for any worsening or new symptoms.     ____________________________________________  FINAL CLINICAL IMPRESSION(S) / ED DIAGNOSES  Final diagnoses:  Strep pharyngitis      NEW MEDICATIONS STARTED DURING THIS VISIT:  ED Discharge Orders         Ordered    cefdinir (OMNICEF) 300 MG capsule  2 times daily     10/15/18 2137    magic mouthwash w/lidocaine SOLN  4 times daily    Note to Pharmacy:  Dispense in a 1/1/1 ratio. Use lidocaine, diphenhydramine, prednisolone   10/15/18 2137              This chart was dictated using voice recognition software/Dragon. Despite best efforts to proofread, errors can  occur which can change the meaning. Any change was purely unintentional.    Racheal Patches, PA-C 10/15/18 2138    Dionne Bucy, MD 10/15/18 2314

## 2018-10-15 NOTE — ED Triage Notes (Signed)
Patient ambulatory to triage with steady gait, without difficulty or distress noted; pt reports sore throat; prod cough green sputum

## 2018-11-17 ENCOUNTER — Other Ambulatory Visit: Payer: Self-pay

## 2018-11-17 ENCOUNTER — Encounter: Payer: Self-pay | Admitting: *Deleted

## 2018-11-25 ENCOUNTER — Ambulatory Visit: Payer: Self-pay | Admitting: Anesthesiology

## 2018-11-25 ENCOUNTER — Ambulatory Visit: Payer: Self-pay | Attending: Anesthesiology

## 2018-11-25 ENCOUNTER — Encounter: Payer: Self-pay | Admitting: Emergency Medicine

## 2018-11-25 ENCOUNTER — Other Ambulatory Visit: Payer: Self-pay

## 2018-11-25 ENCOUNTER — Encounter
Admission: RE | Disposition: A | Discharge status: Other Acute Inpatient Hospital | Payer: Self-pay | Source: Home / Self Care | Attending: Otolaryngology

## 2018-11-25 ENCOUNTER — Ambulatory Visit
Admission: RE | Admit: 2018-11-25 | Discharge: 2018-11-25 | Disposition: A | Discharge status: Other Acute Inpatient Hospital | Payer: Self-pay | Attending: Otolaryngology | Admitting: Otolaryngology

## 2018-11-25 ENCOUNTER — Emergency Department: Payer: Self-pay

## 2018-11-25 ENCOUNTER — Inpatient Hospital Stay
Admission: EM | Admit: 2018-11-25 | Discharge: 2018-11-26 | DRG: 177 | Disposition: A | Discharge status: Home / Self Care | Payer: Self-pay | Attending: Internal Medicine | Admitting: Internal Medicine

## 2018-11-25 DIAGNOSIS — Z793 Long term (current) use of hormonal contraceptives: Secondary | ICD-10-CM | POA: Insufficient documentation

## 2018-11-25 DIAGNOSIS — Z9049 Acquired absence of other specified parts of digestive tract: Secondary | ICD-10-CM

## 2018-11-25 DIAGNOSIS — R0603 Acute respiratory distress: Secondary | ICD-10-CM | POA: Diagnosis present

## 2018-11-25 DIAGNOSIS — J984 Other disorders of lung: Secondary | ICD-10-CM

## 2018-11-25 DIAGNOSIS — J189 Pneumonia, unspecified organism: Secondary | ICD-10-CM

## 2018-11-25 DIAGNOSIS — R7981 Abnormal blood-gas level: Secondary | ICD-10-CM | POA: Insufficient documentation

## 2018-11-25 DIAGNOSIS — J690 Pneumonitis due to inhalation of food and vomit: Principal | ICD-10-CM | POA: Diagnosis present

## 2018-11-25 DIAGNOSIS — Z87891 Personal history of nicotine dependence: Secondary | ICD-10-CM | POA: Insufficient documentation

## 2018-11-25 DIAGNOSIS — J9601 Acute respiratory failure with hypoxia: Secondary | ICD-10-CM

## 2018-11-25 DIAGNOSIS — J3501 Chronic tonsillitis: Secondary | ICD-10-CM | POA: Insufficient documentation

## 2018-11-25 DIAGNOSIS — Z6836 Body mass index (BMI) 36.0-36.9, adult: Secondary | ICD-10-CM | POA: Insufficient documentation

## 2018-11-25 DIAGNOSIS — Z79899 Other long term (current) drug therapy: Secondary | ICD-10-CM

## 2018-11-25 HISTORY — PX: TONSILLECTOMY AND ADENOIDECTOMY: SHX28

## 2018-11-25 LAB — BASIC METABOLIC PANEL
Anion gap: 8 (ref 5–15)
BUN: 11 mg/dL (ref 6–20)
CO2: 22 mmol/L (ref 22–32)
Calcium: 9.2 mg/dL (ref 8.9–10.3)
Chloride: 106 mmol/L (ref 98–111)
Creatinine, Ser: 0.65 mg/dL (ref 0.44–1.00)
GFR calc Af Amer: >60 mL/min (ref >60)
GFR calc non Af Amer: >60 mL/min (ref >60)
GLUCOSE: 127 mg/dL — AB (ref 70–99)
Potassium: 3.9 mmol/L (ref 3.5–5.1)
Sodium: 136 mmol/L (ref 135–145)

## 2018-11-25 LAB — CBC WITH DIFFERENTIAL/PLATELET
Abs Immature Granulocytes: 0.05 10*3/uL (ref 0.00–0.07)
BASOS PCT: 0 %
Basophils Absolute: 0 10*3/uL (ref 0.0–0.1)
EOS PCT: 0 %
Eosinophils Absolute: 0 10*3/uL (ref 0.0–0.5)
HCT: 44.2 % (ref 36.0–46.0)
Hemoglobin: 14.5 g/dL (ref 12.0–15.0)
Immature Granulocytes: 0 %
Lymphocytes Relative: 4 %
Lymphs Abs: 0.5 10*3/uL — ABNORMAL LOW (ref 0.7–4.0)
MCH: 27.3 pg (ref 26.0–34.0)
MCHC: 32.8 g/dL (ref 30.0–36.0)
MCV: 83.2 fL (ref 80.0–100.0)
Monocytes Absolute: 0.1 10*3/uL (ref 0.1–1.0)
Monocytes Relative: 1 %
Neutro Abs: 14 10*3/uL — ABNORMAL HIGH (ref 1.7–7.7)
Neutrophils Relative %: 95 %
Platelets: 292 10*3/uL (ref 150–400)
RBC: 5.31 MIL/uL — ABNORMAL HIGH (ref 3.87–5.11)
RDW: 14.5 % (ref 11.5–15.5)
WBC: 14.7 10*3/uL — ABNORMAL HIGH (ref 4.0–10.5)
nRBC: 0 % (ref 0.0–0.2)

## 2018-11-25 LAB — HCG, QUANTITATIVE, PREGNANCY: hCG, Beta Chain, Quant, S: <1 m[IU]/mL (ref <5)

## 2018-11-25 LAB — INFLUENZA PANEL BY PCR (TYPE A & B)
Influenza A By PCR: NEGATIVE
Influenza B By PCR: NEGATIVE

## 2018-11-25 LAB — POCT PREGNANCY, URINE: Preg Test, Ur: NEGATIVE

## 2018-11-25 SURGERY — TONSILLECTOMY AND ADENOIDECTOMY
Anesthesia: General | Site: Throat | Laterality: Bilateral

## 2018-11-25 MED ORDER — LIDOCAINE VISCOUS HCL 2 % MT SOLN: 10.0000 mL | Freq: Four times a day (QID) | OROMUCOSAL | 0 refills | Status: DC | PRN

## 2018-11-25 MED ORDER — SODIUM CHLORIDE 0.9 % IV BOLUS
500.0000 mL | Freq: Once | INTRAVENOUS | Status: AC
  Administered 2018-11-25: 500 mL via INTRAVENOUS

## 2018-11-25 MED ORDER — OXYCODONE HCL 5 MG PO TABS: 5.0000 mg | ORAL_TABLET | Freq: Once | ORAL | Status: AC | PRN

## 2018-11-25 MED ORDER — OXYCODONE HCL 5 MG/5ML PO SOLN
5.0000 mg | Freq: Once | ORAL | Status: AC | PRN
  Administered 2018-11-25: 5 mg via ORAL

## 2018-11-25 MED ORDER — METHYLPREDNISOLONE SODIUM SUCC 125 MG IJ SOLR: 60.0000 mg | INTRAMUSCULAR | Status: DC

## 2018-11-25 MED ORDER — ENOXAPARIN SODIUM 40 MG/0.4ML ~~LOC~~ SOLN
40.0000 mg | SUBCUTANEOUS | Status: DC
  Administered 2018-11-25: 40 mg via SUBCUTANEOUS
  Filled 2018-11-25: qty 0.4

## 2018-11-25 MED ORDER — BUPIVACAINE HCL (PF) 0.25 % IJ SOLN
INTRAMUSCULAR | Status: DC | PRN
  Administered 2018-11-25: 1 mL

## 2018-11-25 MED ORDER — IOHEXOL 350 MG/ML SOLN
75.0000 mL | Freq: Once | INTRAVENOUS | Status: AC | PRN
  Administered 2018-11-25: 75 mL via INTRAVENOUS

## 2018-11-25 MED ORDER — ALBUTEROL SULFATE (2.5 MG/3ML) 0.083% IN NEBU: 2.5000 mg | INHALATION_SOLUTION | RESPIRATORY_TRACT | Status: DC | PRN

## 2018-11-25 MED ORDER — FENTANYL CITRATE (PF) 100 MCG/2ML IJ SOLN
INTRAMUSCULAR | Status: DC | PRN
  Administered 2018-11-25 (×2): 25 ug via INTRAVENOUS
  Administered 2018-11-25: 100 ug via INTRAVENOUS

## 2018-11-25 MED ORDER — ACETAMINOPHEN 650 MG RE SUPP: 650.0000 mg | Freq: Four times a day (QID) | RECTAL | Status: DC | PRN

## 2018-11-25 MED ORDER — SCOPOLAMINE 1 MG/3DAYS TD PT72
1.0000 | MEDICATED_PATCH | TRANSDERMAL | Status: DC
  Administered 2018-11-25: 1.5 mg via TRANSDERMAL

## 2018-11-25 MED ORDER — ACETAMINOPHEN 10 MG/ML IV SOLN
1000.0000 mg | Freq: Once | INTRAVENOUS | Status: AC
  Administered 2018-11-25: 1000 mg via INTRAVENOUS

## 2018-11-25 MED ORDER — LIDOCAINE HCL (CARDIAC) PF 100 MG/5ML IV SOSY
PREFILLED_SYRINGE | INTRAVENOUS | Status: DC | PRN
  Administered 2018-11-25: 40 mg via INTRAVENOUS

## 2018-11-25 MED ORDER — ONDANSETRON HCL 4 MG PO TABS: 4.0000 mg | ORAL_TABLET | Freq: Three times a day (TID) | ORAL | 0 refills | Status: DC | PRN

## 2018-11-25 MED ORDER — FUROSEMIDE 10 MG/ML IJ SOLN
10.0000 mg | Freq: Once | INTRAMUSCULAR | Status: AC
  Administered 2018-11-25: 10 mg via INTRAVENOUS

## 2018-11-25 MED ORDER — ONDANSETRON HCL 4 MG/2ML IJ SOLN: 4.0000 mg | Freq: Four times a day (QID) | INTRAMUSCULAR | Status: DC | PRN

## 2018-11-25 MED ORDER — OXYMETAZOLINE HCL 0.05 % NA SOLN
NASAL | Status: DC | PRN
  Administered 2018-11-25: 1 via TOPICAL

## 2018-11-25 MED ORDER — IPRATROPIUM-ALBUTEROL 0.5-2.5 (3) MG/3ML IN SOLN
3.0000 mL | Freq: Once | RESPIRATORY_TRACT | Status: AC
  Administered 2018-11-25: 3 mL via RESPIRATORY_TRACT
  Filled 2018-11-25: qty 3

## 2018-11-25 MED ORDER — FENTANYL CITRATE (PF) 100 MCG/2ML IJ SOLN
25.0000 ug | INTRAMUSCULAR | Status: DC | PRN
  Administered 2018-11-25: 25 ug via INTRAVENOUS

## 2018-11-25 MED ORDER — SUCCINYLCHOLINE CHLORIDE 20 MG/ML IJ SOLN
INTRAMUSCULAR | Status: DC | PRN
  Administered 2018-11-25: 80 mg via INTRAVENOUS

## 2018-11-25 MED ORDER — PROPOFOL 10 MG/ML IV BOLUS
INTRAVENOUS | Status: DC | PRN
  Administered 2018-11-25: 150 mg via INTRAVENOUS
  Administered 2018-11-25: 50 mg via INTRAVENOUS

## 2018-11-25 MED ORDER — SODIUM CHLORIDE 0.9 % IV SOLN
3.0000 g | Freq: Four times a day (QID) | INTRAVENOUS | Status: DC
  Administered 2018-11-25 – 2018-11-26 (×3): 3 g via INTRAVENOUS
  Filled 2018-11-25 (×5): qty 3

## 2018-11-25 MED ORDER — ONDANSETRON HCL 4 MG PO TABS: 4.0000 mg | ORAL_TABLET | Freq: Four times a day (QID) | ORAL | Status: DC | PRN

## 2018-11-25 MED ORDER — SODIUM CHLORIDE 0.9 % IV SOLN
Freq: Once | INTRAVENOUS | Status: AC
  Administered 2018-11-25: 21:00:00 via INTRAVENOUS

## 2018-11-25 MED ORDER — ONDANSETRON HCL 4 MG/2ML IJ SOLN
INTRAMUSCULAR | Status: DC | PRN
  Administered 2018-11-25: 4 mg via INTRAVENOUS

## 2018-11-25 MED ORDER — GLYCOPYRROLATE 0.2 MG/ML IJ SOLN
INTRAMUSCULAR | Status: DC | PRN
  Administered 2018-11-25: 0.1 mg via INTRAVENOUS

## 2018-11-25 MED ORDER — MIDAZOLAM HCL 5 MG/5ML IJ SOLN
INTRAMUSCULAR | Status: DC | PRN
  Administered 2018-11-25: 2 mg via INTRAVENOUS

## 2018-11-25 MED ORDER — OXYCODONE HCL 5 MG/5ML PO SOLN: 10.0000 mg | Freq: Four times a day (QID) | ORAL | 0 refills | Status: AC | PRN

## 2018-11-25 MED ORDER — LACTATED RINGERS IV SOLN
INTRAVENOUS | Status: DC
  Administered 2018-11-25: 08:00:00 via INTRAVENOUS

## 2018-11-25 MED ORDER — SODIUM CHLORIDE 0.9 % IV SOLN: 3.0000 g | Freq: Once | INTRAVENOUS | Status: DC

## 2018-11-25 MED ORDER — ACETAMINOPHEN 325 MG PO TABS: 650.0000 mg | ORAL_TABLET | Freq: Four times a day (QID) | ORAL | Status: DC | PRN

## 2018-11-25 MED ORDER — DEXAMETHASONE SODIUM PHOSPHATE 4 MG/ML IJ SOLN
INTRAMUSCULAR | Status: DC | PRN
  Administered 2018-11-25: 10 mg via INTRAVENOUS

## 2018-11-25 MED ORDER — ALBUTEROL SULFATE (2.5 MG/3ML) 0.083% IN NEBU
2.5000 mg | INHALATION_SOLUTION | Freq: Once | RESPIRATORY_TRACT | Status: AC
  Administered 2018-11-25: 2.5 mg via RESPIRATORY_TRACT

## 2018-11-25 MED ORDER — OXYCODONE HCL 5 MG PO TABS
5.0000 mg | ORAL_TABLET | ORAL | Status: DC | PRN
  Administered 2018-11-25 – 2018-11-26 (×2): 5 mg via ORAL
  Filled 2018-11-25 (×2): qty 1

## 2018-11-25 SURGICAL SUPPLY — 16 items
BLADE BOVIE TIP EXT 4 (BLADE) ×3 IMPLANT
CANISTER SUCT 1200ML W/VALVE (MISCELLANEOUS) ×3 IMPLANT
CATH ROBINSON RED A/P 10FR (CATHETERS) ×3 IMPLANT
COAG SUCT 10F 3.5MM HAND CTRL (MISCELLANEOUS) ×3 IMPLANT
ELECT REM PT RETURN 9FT ADLT (ELECTROSURGICAL) ×3
ELECTRODE REM PT RTRN 9FT ADLT (ELECTROSURGICAL) ×1 IMPLANT
GLOVE BIOGEL M 7.0 STRL (GLOVE) ×6 IMPLANT
KIT TURNOVER KIT A (KITS) ×3 IMPLANT
NEEDLE HYPO 25GX1X1/2 BEV (NEEDLE) ×3 IMPLANT
NS IRRIG 500ML POUR BTL (IV SOLUTION) ×3 IMPLANT
PACK TONSIL AND ADENOID CUSTOM (PACKS) ×3 IMPLANT
PENCIL SMOKE EVACUATOR (MISCELLANEOUS) ×3 IMPLANT
SLEEVE SUCTION 125 (MISCELLANEOUS) ×3 IMPLANT
SOL ANTI-FOG 6CC FOG-OUT (MISCELLANEOUS) ×1 IMPLANT
SOL FOG-OUT ANTI-FOG 6CC (MISCELLANEOUS) ×2
SYR 5ML LL (SYRINGE) ×3 IMPLANT

## 2018-11-25 NOTE — ED Notes (Signed)
Pt trialed off of oxygen therapy.  Pt remains 87-89% on room air.  Pt placed back on 2L nasal cannula.  EDP, Roxan Hockey notified.

## 2018-11-25 NOTE — Progress Notes (Signed)
Pt s/p T&A by Dr. Andee Poles at [facility] surgery center. Pt had uneventful general anesthesia with ETT.  Upon arrival to PACU, pt was coughing with sats in low 80's.  Face mask applied.   Chest sounds with expiratory wheezing. Albuterol nebulizers given.  Room air sats in upper 80's.  Incentive spirometer given to patient.   Pt is A&O. Denies CP or SOB.   Some pink frothy sputum noted with cough. Chest: basilar rales.  10 mg IV lasix given.  CXR: 'Rather subtle ill-defined opacity left base, concerning for early pneumonia. Lungs elsewhere clear.'  No frank aspiration or negative pressure pulmonary edema events noted during the anesthetic.   Pt afebrile. Sat 88% on RA; 96% with face mask. Otherwise, VSS. She quit smoking in Nov 2019.  D/W [place].  Decision to transfer pt via ambulance to ER.

## 2018-11-25 NOTE — ED Notes (Signed)
Pt waiting on admission bed   Pt alert sinus brady on monitor.  Family with pt

## 2018-11-25 NOTE — ED Triage Notes (Signed)
Pt in via EMS [facility] EMS reports per facility, pt had tonsils removed today and post op her O2 sats did not return to normal and she had rales ans wheezing. EMS reports pt was given albuterol and lasix. EMS reports upon their arrival pt was uper 90's in sats on NRB.  Pt reports she does not feel like she cannot breathe and she doesn't feel like she has difficulty when she does breath. Pt only c/o pain to her throat. Pt speaking in complete sentences with RN.

## 2018-11-25 NOTE — Anesthesia Procedure Notes (Signed)
Procedure Name: Intubation Date/Time: 11/25/2018 9:14 AM Performed by: Jimmy Picket, CRNA Pre-anesthesia Checklist: Patient identified, Emergency Drugs available, Suction available, Patient being monitored and Timeout performed Patient Re-evaluated:Patient Re-evaluated prior to induction Oxygen Delivery Method: Circle system utilized Preoxygenation: Pre-oxygenation with 100% oxygen Induction Type: IV induction Ventilation: Mask ventilation without difficulty Laryngoscope Size: Miller and 2 Grade View: Grade I Tube type: Oral Rae Tube size: 7.0 mm Number of attempts: 1 Placement Confirmation: ETT inserted through vocal cords under direct vision,  positive ETCO2 and breath sounds checked- equal and bilateral Tube secured with: Tape Dental Injury: Teeth and Oropharynx as per pre-operative assessment

## 2018-11-25 NOTE — Op Note (Signed)
..  11/25/2018  9:52 AM    Steele Sizer  [id number]   Pre-Op Dx:  Tonsil hypertrophy, chronic tonsillitis  Post-op Dx:  same  Proc:Tonsillectomy > age 23  Surg: Mahika Vanvoorhis  Anes:  General Endotracheal  EBL:  10ml  Comp:  None  Findings:  3+ cryptic and friable tonsils with significant underlying scar to the musculature on superior poles.  Absent adenoid tissue.  Procedure: After the patient was identified in holding and the history and physical and consent was reviewed, the patient was taken to the operating room and placed in a supine position.  General endotracheal anesthesia was induced in the normal fashion.  At this time, the patient was rotated 45 degrees and a shoulder roll was placed.  At this time, a McIvor mouthgag was inserted into the patient's oral cavity and suspended from the Mayo stand without injury to teeth, lips, or gums.  Next a red rubber catheter was inserted into the patient left nostril for retraction of the uvula and soft palate superiorly.  Next a curved Alice clamp was attached to the patient's right superior tonsillar pole and retracted medially and inferiorly.  A Bovie electrocautery was used to dissect the patient's right tonsil in a subcapsular plane.  Meticulous hemostasis was achieved with Bovie suction cautery.  At this time, the mouth gag was released from suspension for 1 minute.  Attention now was directed to the patient's left side.  In a similar fashion the curved Alice clamp was attached to the superior pole and this was retracted medially and inferiorly and the tonsil was excised in a subcapsular plane with Bovie electrocautery.  After completion of the second tonsil, meticulous hemostasis was continued.  At this time, attention was directed to the patient's Adenoids.  Under indirect visualization using an operating mirror, the adenoid tissue was visualized and noted to be absent in nature.    At this time, the patient's nasal cavity  and oral cavity was irrigated with sterile saline.  One ml of 0.25% Marcaine was injected into the anterior and posterior tonsillar fossa bilaterally.  Following this  The care of patient was returned to anesthesia, awakened, and transferred to recovery in stable condition.  Dispo:  PACU to home  Plan: Soft diet.  Limit exercise and strenuous activity for 2 weeks.  Fluid hydration  Recheck my office three weeks.   Artha Stavros 9:52 AM 11/25/2018

## 2018-11-25 NOTE — ED Notes (Signed)
Report called to cory rn floor nurse

## 2018-11-25 NOTE — H&P (Signed)
..  History and Physical paper copy reviewed and updated date of procedure and will be scanned into system.  Patient seen and examined.  

## 2018-11-25 NOTE — ED Notes (Signed)
Pt 86% on RA, placed on 2L of O2, now at 95% will continue to monitor.

## 2018-11-25 NOTE — Anesthesia Postprocedure Evaluation (Signed)
Anesthesia Post Note  Patient: Loss adjuster, chartered  Procedure(s) Performed: TONSILLECTOMY (Bilateral Throat)  Patient location during evaluation: PACU Anesthesia Type: General Level of consciousness: awake and alert Pain management: pain level controlled Vital Signs Assessment: vitals unstable Respiratory status: spontaneous breathing, nonlabored ventilation, patient connected to nasal cannula oxygen, patient connected to face mask oxygen and respiratory function stable Cardiovascular status: blood pressure returned to baseline and stable Postop Assessment: no apparent nausea or vomiting Anesthetic complications: no (pt with mid-80's sats; pink frothy sputum noted; CXR showed LLL opacity. Pt transfered to ER via amulance.)   See Progress note for details.  Orrin Brigham

## 2018-11-25 NOTE — ED Notes (Signed)
Pt's friend Joellyn Rued left her contact info for discharge transportation [phone]

## 2018-11-25 NOTE — ED Notes (Signed)
Resumed care from [facility].  Pt alert.  Family with pt.  Sinus tach on monitor.  Iv fluids infusing.

## 2018-11-25 NOTE — Consult Note (Signed)
Pharmacy Antibiotic Note  Leslie Kerr is a 23 y.o. female admitted on 11/25/2018 with aspiration pneumonia.  Pharmacy has been consulted for Unasyn dosing.  Plan: Unasyn 3 gm IV every 6 hours  Height: 5\' 6"  (167.6 cm) Weight: 218 lb (98.9 kg) IBW/kg (Calculated) : 59.3  Temp (24hrs), Avg:97.7 F (36.5 C), Min:97.2 F (36.2 C), Max:98.9 F (37.2 C)  Recent Labs  Lab 11/25/18 1453  WBC 14.7*  CREATININE 0.65    Estimated Creatinine Clearance: 131.9 mL/min (by C-G formula based on SCr of 0.65 mg/dL).    No Known Allergies  Antimicrobials this admission: Unasyn 2/12 >>   Dose adjustments this admission:  Microbiology results:  Thank you for allowing pharmacy to be a part of this patient's care.  Orinda Kenner, PharmD Clinical Pharmacist 11/25/2018 4:56 PM

## 2018-11-25 NOTE — ED Notes (Signed)
Patient transported to X-ray 

## 2018-11-25 NOTE — ED Notes (Signed)
ED Provider at bedside. 

## 2018-11-25 NOTE — Transfer of Care (Signed)
Immediate Anesthesia Transfer of Care Note  Patient: Leslie Kerr  Procedure(s) Performed: TONSILLECTOMY (Bilateral Throat)  Patient Location: PACU  Anesthesia Type: General ETT  Level of Consciousness: awake, alert  and patient cooperative  Airway and Oxygen Therapy: Patient Spontanous Breathing and Patient connected to supplemental oxygen  Post-op Assessment: Post-op Vital signs reviewed, Patient's Cardiovascular Status Stable, Respiratory Function Stable, Patent Airway and No signs of Nausea or vomiting  Post-op Vital Signs: Reviewed and stable  Complications: No apparent anesthesia complications

## 2018-11-25 NOTE — Anesthesia Preprocedure Evaluation (Signed)
Anesthesia Evaluation  Patient identified by MRN, date of birth, ID band  Reviewed: NPO status   History of Anesthesia Complications Negative for: history of anesthetic complications  Airway Mallampati: II  TM Distance: >3 FB Neck ROM: full    Dental no notable dental hx.    Pulmonary neg pulmonary ROS, former smoker,    Pulmonary exam normal        Cardiovascular Exercise Tolerance: Good negative cardio ROS Normal cardiovascular exam     Neuro/Psych negative neurological ROS  negative psych ROS   GI/Hepatic negative GI ROS, Neg liver ROS,   Endo/Other  Morbid obesity (bmi 36)  Renal/GU negative Renal ROS  negative genitourinary   Musculoskeletal   Abdominal   Peds  Hematology negative hematology ROS (+)   Anesthesia Other Findings   Reproductive/Obstetrics negative OB ROS                             Anesthesia Physical Anesthesia Plan  ASA: II  Anesthesia Plan: General ETT   Post-op Pain Management:    Induction:   PONV Risk Score and Plan:   Airway Management Planned:   Additional Equipment:   Intra-op Plan:   Post-operative Plan:   Informed Consent: I have reviewed the patients History and Physical, chart, labs and discussed the procedure including the risks, benefits and alternatives for the proposed anesthesia with the patient or authorized representative who has indicated his/her understanding and acceptance.       Plan Discussed with: CRNA  Anesthesia Plan Comments:         Anesthesia Quick Evaluation

## 2018-11-25 NOTE — H&P (Signed)
[facility] [place] at [facility]   PATIENT NAME: Steele SizerCaitlen Verdi    MR#:  [id number]  DATE OF BIRTH:  1996/01/22  DATE OF ADMISSION:  11/25/2018  PRIMARY CARE PHYSICIAN: Patient, No Pcp Per   REQUESTING/REFERRING PHYSICIAN: Dr. Roxan Hockeyobinson  CHIEF COMPLAINT:  patient was sent from outpatient [place] surgery unit with hypoxia and respiratory distress post-tonsillectomy.  HISTORY OF PRESENT ILLNESS:  Steele SizerCaitlen Chohan  is a 23 y.o. female with a known history of chronic tonsillitis and throat infection [facility] Dr.Vaught ENT. Post extubation patient was noted to be hypoxic on room air. She received breathing treatment was placed on oxygen and given incentive spirometer. Patient continued to remain sats in the 80s. Chest x-ray was done which showed possible infiltrate versus atelectasis.  Patient was sent to ER by ambulance. Currently she is tachycardic. Her stats are 93 to 94% on 3 L nasal cannula. She denies any respiratory distress.  CT chest was done which shows ground glass opacity's and upper lobes with possibility of aspiration pneumonitis. Patient received a dose of IV unasyn.  She is being admitted for further evaluation of management.  PAST MEDICAL HISTORY:   Past Medical History:  Diagnosis Date  . Kidney infection     PAST SURGICAL HISTOIRY:   Past Surgical History:  Procedure Laterality Date  . NO PAST SURGERIES      SOCIAL HISTORY:   Social History   Tobacco Use  . Smoking status: Former Smoker    Years: 2.00    Types: Cigarettes    Last attempt to quit: 08/2018    Years since quitting: 0.2  . Smokeless tobacco: Never Used  Substance Use Topics  . Alcohol use: Yes    Frequency: Never    Comment: 1-2x/month    FAMILY HISTORY:  No family history on file.  DRUG ALLERGIES:  No Known Allergies  REVIEW OF SYSTEMS:  Review of Systems  Constitutional: Negative for chills, fever and  weight loss.  HENT: Positive for sore throat. Negative for ear discharge, ear pain and nosebleeds.   Eyes: Negative for blurred vision, pain and discharge.  Respiratory: Positive for cough and shortness of breath. Negative for sputum production, wheezing and stridor.   Cardiovascular: Negative for chest pain, palpitations, orthopnea and PND.  Gastrointestinal: Negative for abdominal pain, diarrhea, nausea and vomiting.  Genitourinary: Negative for frequency and urgency.  Musculoskeletal: Negative for back pain and joint pain.  Neurological: Negative for sensory change, speech change, focal weakness and weakness.  Psychiatric/Behavioral: Negative for depression and hallucinations. The patient is not nervous/anxious.      MEDICATIONS AT HOME:   Prior to Admission medications   Medication Sig Start Date End Date Taking? Authorizing Provider  lidocaine (XYLOCAINE) 2 % solution Use as directed 10 mLs in the mouth or throat every 6 (six) hours as needed for mouth pain (Swish and spit). 11/25/18  Yes Vaught, Creighton, MD  norethindrone-ethinyl estradiol (JUNEL FE,GILDESS FE,LOESTRIN FE) 1-20 MG-MCG tablet Take 1 tablet by mouth daily.   Yes [provider]  ondansetron (ZOFRAN) 4 MG tablet Take 1 tablet (4 mg total) by mouth every 8 (eight) hours as needed for up to 10 doses for nausea or vomiting. 11/25/18  Yes Vaught, Roney Mansreighton, MD  oxyCODONE (ROXICODONE) 5 MG/5ML solution Take 10 mLs (10 mg total) by mouth every 6 (six) hours as needed for up to 7 days for severe pain. 11/25/18 12/02/18 Yes Vaught, Roney Mansreighton, MD  VITAL SIGNS:  Blood pressure 92/61, pulse (!) 111, temperature 98.9 F (37.2 C), temperature source Oral, resp. rate 13, height 5\' 6"  (1.676 m), weight 98.9 kg, last menstrual period 11/24/2018, SpO2 100 %.  PHYSICAL EXAMINATION:  GENERAL:  23 y.o.-year-old patient lying in the bed with no acute distress.  EYES: Pupils equal, round, reactive to light and accommodation. No  scleral icterus. Extraocular muscles intact.  HEENT: Head atraumatic, normocephalic. Oropharynx and nasopharynx clear.  NECK:  Supple, no jugular venous distention. No thyroid enlargement, no tenderness.  LUNGS: decreased breath sounds bilaterally, no wheezing, rales,rhonchi or crepitation. No use of accessory muscles of respiration.  CARDIOVASCULAR: S1, S2 normal. No murmurs, rubs, or gallops.  ABDOMEN: Soft, nontender, nondistended. Bowel sounds present. No organomegaly or mass.  EXTREMITIES: No pedal edema, cyanosis, or clubbing.  NEUROLOGIC: Cranial nerves II through XII are intact. Muscle strength 5/5 in all extremities. Sensation intact. Gait not checked.  PSYCHIATRIC: The patient is alert and oriented x 3.  SKIN: No obvious rash, lesion, or ulcer.   LABORATORY PANEL:   CBC Recent Labs  Lab 11/25/18 1453  WBC 14.7*  HGB 14.5  HCT 44.2  PLT 292   ------------------------------------------------------------------------------------------------------------------  Chemistries  Recent Labs  Lab 11/25/18 1453  NA 136  K 3.9  CL 106  CO2 22  GLUCOSE 127*  BUN 11  CREATININE 0.65  CALCIUM 9.2   ------------------------------------------------------------------------------------------------------------------  Cardiac Enzymes No results for input(s): TROPONINI in the last 168 hours. ------------------------------------------------------------------------------------------------------------------  RADIOLOGY:  Dg Chest 2 View  Result Date: 11/25/2018 CLINICAL DATA:  Hypoxia, rales, and wheezing. Status post tonsillectomy today. EXAM: CHEST - 2 VIEW COMPARISON:  11/25/2018 FINDINGS: Low lung volumes are noted. Mild atelectasis or infiltrates are seen in both lung bases. No evidence of pleural effusion. Heart size and mediastinal contours are within normal limits. IMPRESSION: Low lung volumes with mild bibasilar atelectasis versus infiltrates. Electronically Signed   By: Myles Rosenthal M.D.   On: 11/25/2018 13:42   Ct Angio Chest Pe W And/or Wo Contrast  Result Date: 11/25/2018 CLINICAL DATA:  23 year old female with hypoxia and shortness of breath following tonsil surgery today. EXAM: CT ANGIOGRAPHY CHEST WITH CONTRAST TECHNIQUE: Multidetector CT imaging of the chest was performed using the standard protocol during bolus administration of intravenous contrast. Multiplanar CT image reconstructions and MIPs were obtained to evaluate the vascular anatomy. CONTRAST:  78mL OMNIPAQUE IOHEXOL 350 MG/ML SOLN COMPARISON:  None. FINDINGS: Cardiovascular: This is a technically borderline study due to respiratory motion artifact. No pulmonary emboli are identified. Heart size normal. No thoracic aortic aneurysm or pericardial effusion. Mediastinum/Nodes: No enlarged mediastinal, hilar, or axillary lymph nodes. Thyroid gland, trachea, and esophagus demonstrate no significant findings. Lungs/Pleura: Moderate bilateral ground-glass and airspace opacities identified, greatest in the posterior UPPER lobes and throughout both LOWER lobes. No mass, pleural effusion or pneumothorax identified. Upper Abdomen: No acute abnormality. Musculoskeletal: No acute or suspicious abnormality. Review of the MIP images confirms the above findings. IMPRESSION: 1. Moderate bilateral ground-glass and airspace opacities, nonspecific but favor edema over infectious/inflammatory causes given history. 2. No definite pulmonary emboli, but sensitivity slightly decreased secondary to respiratory motion artifact. Electronically Signed   By: Harmon Pier M.D.   On: 11/25/2018 16:03   Dg Chest Port 1 View  Result Date: 11/25/2018 CLINICAL DATA:  Decreased oxygen saturation EXAM: PORTABLE CHEST 1 VIEW COMPARISON:  None. FINDINGS: There is ill-defined opacity in the lateral left base concerning for early pneumonia. Lungs elsewhere are clear. Heart size  and pulmonary vascularity are normal. No adenopathy. No bone lesions.  IMPRESSION: Rather subtle ill-defined opacity left base, concerning for early pneumonia. Lungs elsewhere clear. Cardiac silhouette within normal limits. These results will be called to the ordering clinician or representative by the Radiologist Assistant, and communication documented in the PACS or zVision Dashboard. Electronically Signed   By: Bretta BangWilliam  Woodruff III M.D.   On: 11/25/2018 11:21    EKG:    IMPRESSION AND PLAN:   Steele SizerCaitlen Sagona  is a 23 y.o. female with a known history of chronic tonsillitis and throat infection [facility].V[facility] E[facility]. Post extubation patient was noted to be hypoxic on room air. She received breathing treatment was placed on oxygen and given incentive spirometer. Patient continued to remain sats in the 80s. Chest x-ray was done which showed possible infiltrate versus atelectasis.  1. Acute hypoxic respiratory failure suspected due to pneumonitis likely aspiration post extubation s/p tonsillectomy -admit to medical floor -IV fluids -soft diet -patient advised not to use straw to drink fluids -IV Solu-Medrol 60 mg daily -IV antibiotics--- pharmacy consult place -incentive spirometer -PRN nebs -wean oxygen is able to  2. Status post tonsillectomy -per ENT -PRN pain meds   3. History of tobacco abuse/vaping advised not to smoke or vape-- she voiced understanding  discussed with Dr. Andee PolesVaught   All the records are reviewed and case discussed with ED provider.   CODE STATUS: full  TOTAL TIME TAKING CARE OF THIS PATIENT: *50* minutes.    Enedina FinnerSona Rashana Andrew M.D on 11/25/2018 at 4:40 PM  Between 7am to 6pm - Pager - [phone]  After 6pm go to [email or web address] - password [email or web address] [facility]  [facility]  Office  [phone]  CC: Primary care physician; Patient, No Pcp Per

## 2018-11-25 NOTE — Progress Notes (Signed)
Decision made per anesthesia to transfer patient to ED via ambulance for higher level of care.

## 2018-11-25 NOTE — ED Provider Notes (Signed)
[facility] Emergency Department Provider Note    First MD Initiated Contact with Patient 11/25/18 1228     (approximate)  I have reviewed the triage vital signs and the nursing notes.   HISTORY  Chief Complaint Shortness of Breath; Sore Throat; and Post-op Problem    HPI Leslie Kerr is a 23 y.o. female presents the ER for evaluation of hypoxia.  Patient had outpatient tonsillectomy performed at surgical center today was otherwise feeling well.  Does not feel particularly short of breath but is still drowsy after the surgery.  Is having some mild sore throat.  She used to vape but states that she has not done that in several weeks.  Denies any recent fevers.    Past Medical History:  Diagnosis Date  . Kidney infection    No family history on file. Past Surgical History:  Procedure Laterality Date  . NO PAST SURGERIES     Patient Active Problem List   Diagnosis Date Noted  . Lightheadedness 01/13/2018  . Pyelonephritis 12/26/2017      Prior to Admission medications   Medication Sig Start Date End Date Taking? Authorizing Provider  lidocaine (XYLOCAINE) 2 % solution Use as directed 10 mLs in the mouth or throat every 6 (six) hours as needed for mouth pain (Swish and spit). 11/25/18  Yes Vaught, Creighton, MD  norethindrone-ethinyl estradiol (JUNEL FE,GILDESS FE,LOESTRIN FE) 1-20 MG-MCG tablet Take 1 tablet by mouth daily.   Yes [provider]  ondansetron (ZOFRAN) 4 MG tablet Take 1 tablet (4 mg total) by mouth every 8 (eight) hours as needed for up to 10 doses for nausea or vomiting. 11/25/18  Yes Vaught, Roney Mansreighton, MD  oxyCODONE (ROXICODONE) 5 MG/5ML solution Take 10 mLs (10 mg total) by mouth every 6 (six) hours as needed for up to 7 days for severe pain. 11/25/18 12/02/18 Yes Vaught, Roney Mansreighton, MD    Allergies Patient has no known allergies.    Social History Social History   Tobacco Use  . Smoking status: Former Smoker   Years: 2.00    Types: Cigarettes    Last attempt to quit: 08/2018    Years since quitting: 0.2  . Smokeless tobacco: Never Used  Substance Use Topics  . Alcohol use: Yes    Frequency: Never    Comment: 1-2x/month  . Drug use: No    Review of Systems Patient denies headaches, rhinorrhea, blurry vision, numbness, shortness of breath, chest pain, edema, cough, abdominal pain, nausea, vomiting, diarrhea, dysuria, fevers, rashes or hallucinations unless otherwise stated above in HPI. ____________________________________________   PHYSICAL EXAM:  VITAL SIGNS: Vitals:   11/25/18 1432 11/25/18 1500  BP: (!) 89/56 92/61  Pulse: (!) 58 (!) 111  Resp: (!) 22 13  Temp:    SpO2: (!) 87% 100%    Constitutional: Alert and oriented.  Slightly drowsy she does appear mildly under the influence of anesthetic but protecting her airway. Eyes: Conjunctivae are normal.  Head: Atraumatic. Nose: No congestion/rhinnorhea. Mouth/Throat: Mucous membranes are moist.  No evidence of active hemorrhage at this time. Neck: No stridor. Painless ROM.  Cardiovascular: Normal rate, regular rhythm. Grossly normal heart sounds.  Good peripheral circulation. Respiratory: Normal respiratory effort.  No retractions. Lungs CTAB. Gastrointestinal: Soft and nontender. No distention. No abdominal bruits. No CVA tenderness. Genitourinary:  Musculoskeletal: No lower extremity tenderness nor edema.  No joint effusions. Neurologic:  Normal speech and language. No gross focal neurologic deficits are appreciated. No facial droop Skin:  Skin  is warm, dry and intact. No rash noted. Psychiatric: Mood and affect are normal. Speech and behavior are normal.  ____________________________________________   LABS (all labs ordered are listed, but only abnormal results are displayed)  Results for orders placed or performed during the hospital encounter of 11/25/18 (from the past 24 hour(s))  CBC with Differential/Platelet      Status: Abnormal   Collection Time: 11/25/18  2:53 PM  Result Value Ref Range   WBC 14.7 (H) 4.0 - 10.5 K/uL   RBC 5.31 (H) 3.87 - 5.11 MIL/uL   Hemoglobin 14.5 12.0 - 15.0 g/dL   HCT 45.4 09.8 - 11.9 %   MCV 83.2 80.0 - 100.0 fL   MCH 27.3 26.0 - 34.0 pg   MCHC 32.8 30.0 - 36.0 g/dL   RDW 14.7 82.9 - 56.2 %   Platelets 292 150 - 400 K/uL   nRBC 0.0 0.0 - 0.2 %   Neutrophils Relative % 95 %   Neutro Abs 14.0 (H) 1.7 - 7.7 K/uL   Lymphocytes Relative 4 %   Lymphs Abs 0.5 (L) 0.7 - 4.0 K/uL   Monocytes Relative 1 %   Monocytes Absolute 0.1 0.1 - 1.0 K/uL   Eosinophils Relative 0 %   Eosinophils Absolute 0.0 0.0 - 0.5 K/uL   Basophils Relative 0 %   Basophils Absolute 0.0 0.0 - 0.1 K/uL   Immature Granulocytes 0 %   Abs Immature Granulocytes 0.05 0.00 - 0.07 K/uL  Basic metabolic panel     Status: Abnormal   Collection Time: 11/25/18  2:53 PM  Result Value Ref Range   Sodium 136 135 - 145 mmol/L   Potassium 3.9 3.5 - 5.1 mmol/L   Chloride 106 98 - 111 mmol/L   CO2 22 22 - 32 mmol/L   Glucose, Bld 127 (H) 70 - 99 mg/dL   BUN 11 6 - 20 mg/dL   Creatinine, Ser 1.30 0.44 - 1.00 mg/dL   Calcium 9.2 8.9 - 86.5 mg/dL   GFR calc non Af Amer >60 >60 mL/min   GFR calc Af Amer >60 >60 mL/min   Anion gap 8 5 - 15   ____________________________________________  ____________________________________________  RADIOLOGY  I personally reviewed all radiographic images ordered to evaluate for the above acute complaints and reviewed radiology reports and findings.  These findings were personally discussed with the patient.  Please see medical record for radiology report.  ____________________________________________   PROCEDURES  Procedure(s) performed:  .Critical Care Performed by: Willy Eddy, MD Authorized by: Willy Eddy, MD   Critical care provider statement:    Critical care time (minutes):  30   Critical care time was exclusive of:  Separately billable  procedures and treating other patients   Critical care was necessary to treat or prevent imminent or life-threatening deterioration of the following conditions:  Respiratory failure   Critical care was time spent personally by me on the following activities:  Development of treatment plan with patient or surrogate, discussions with consultants, evaluation of patient's response to treatment, examination of patient, obtaining history from patient or surrogate, ordering and performing treatments and interventions, ordering and review of laboratory studies, ordering and review of radiographic studies, pulse oximetry, re-evaluation of patient's condition and review of old charts      Critical Care performed: yes ____________________________________________   INITIAL IMPRESSION / ASSESSMENT AND PLAN / ED COURSE  Pertinent labs & imaging results that were available during my care of the patient were reviewed by me and  considered in my medical decision making (see chart for details).   DDX: Pneumonia, asthma, COPD, atelectasis, CHF, aspiration, PE  Leslie Kerr is a 23 y.o. who presents to the ED with hypoxia as described above.  Patient in no acute distress and is protecting her airway right now but does have mild hypoxia requiring supplemental oxygen.  Will initially observe as this may simply be secondary to post anesthetic process and atelectasis.  Will check x-ray.  Clinical Course as of Nov 25 1630  Wed Nov 25, 2018  1429 Patient rechecked.  I do have a high suspicion for atelectasis.  Will turn off oxygen and encourage patient to mobilize and reassess.   [PR]  1435 Patient with persistent hypoxia now with soft blood pressure will give fluids.  She is on birth control and given her hypoxia without other explanation will order CT angiogram to exclude PE.  I anticipate the d-dimer will be essentially useless in this postoperative state.   [PR]  1614 Patient with evidence of pneumonitis.  No  evidence of PE fortunately.  Patient still with hypoxia.88% on room air therefore will placed back on supplemental oxygen and discussed case with ENT and hospitalist.   [PR]    Clinical Course User Index [PR] Willy Eddy, MD     As part of my medical decision making, I reviewed the following data within the electronic MEDICAL RECORD NUMBER Nursing notes reviewed and incorporated, Labs reviewed, notes from prior ED visits and [place] Controlled Substance Database   ____________________________________________   FINAL CLINICAL IMPRESSION(S) / ED DIAGNOSES  Final diagnoses:  Acute respiratory failure with hypoxia (HCC)  Pneumonitis      NEW MEDICATIONS STARTED DURING THIS VISIT:  New Prescriptions   No medications on file     Note:  This document was prepared using Dragon voice recognition software and may include unintentional dictation errors.    Willy Eddy, MD 11/25/18 [phone]

## 2018-11-25 NOTE — Discharge Instructions (Signed)
T & A INSTRUCTION SHEET - [facility] °[facility] ° °CREIGHTON VAUGHT, MD °PAUL H. JUENGEL, MD  °P. SCOTT BENNETT °CHAPMAN MCQUEEN, MD ° °[address] , [place] [address] TEL. [phone] °[address] [place] [address] [phone] ° °INFORMATION SHEET FOR A TONSILLECTOMY AND ADENDOIDECTOMY ° °About Your Tonsils and Adenoids ° The tonsils and adenoids are normal body tissues that are part of our immune system.  They normally help to protect us against diseases that may enter our mouth and nose.  However, sometimes the tonsils and/or adenoids become too large and obstruct our breathing, especially at night. °  ° If either of these things happen it helps to remove the tonsils and adenoids in order to become healthier. The operation to remove the tonsils and adenoids is called a tonsillectomy and adenoidectomy. ° °The Location of Your Tonsils and Adenoids ° The tonsils are located in the back of the throat on both side and sit in a cradle of muscles. The adenoids are located in the roof of the mouth, behind the nose, and closely associated with the opening of the Eustachian tube to the ear. ° °Surgery on Tonsils and Adenoids ° A tonsillectomy and adenoidectomy is a short operation which takes about thirty minutes.  This includes being put to sleep and being awakened.  Tonsillectomies and adenoidectomies are performed at [facility] and may require observation period in the recovery room prior to going home. ° °Following the Operation for a Tonsillectomy ° A cautery machine is used to control bleeding.  Bleeding from a tonsillectomy and adenoidectomy is minimal and postoperatively the risk of bleeding is approximately four percent, although this rarely life threatening. ° ° ° °After your tonsillectomy and adenoidectomy post-op care at home: ° °1. Our patients are able to go home the same day.  You may be given prescriptions for pain  medications and antibiotics, if indicated. °2. It is extremely important to remember that fluid intake is of utmost importance after a tonsillectomy.  The amount that you drink must be maintained in the postoperative period.  A good indication of whether a child is getting enough fluid is whether his/her urine output is constant.  As long as children are urinating or wetting their diaper every 6 - 8 hours this is usually enough fluid intake.   °3. Although rare, this is a risk of some bleeding in the first ten days after surgery.  This is usually occurs between day five and nine postoperatively.  This risk of bleeding is approximately four percent.  If you or your child should have any bleeding you should remain calm and notify our office or go directly to the Emergency Room at [facility] where they will contact us. Our doctors are available seven days a week for notification.  We recommend sitting up quietly in a chair, place an ice pack on the front of the neck and spitting out the blood gently until we are able to contact you.  Adults should gargle gently with ice water and this may help stop the bleeding.  If the bleeding does not stop after a short time, i.e. 10 to 15 minutes, or seems to be increasing again, please contact us or go to the hospital.   °4. It is common for the pain to be worse at 5 - 7 days postoperatively.  This occurs because the “scab” is peeling off and the mucous membrane (skin of   the throat) is growing back where the tonsils were.   5. It is common for a low-grade fever, less than 102, during the first week after a tonsillectomy and adenoidectomy.  It is usually due to not drinking enough liquids, and we suggest your use liquid Tylenol or the pain medicine with Tylenol prescribed in order to keep your temperature below 102.  Please follow the directions on the back of the bottle. 6. Do not take aspirin or any products that contain aspirin such as Bufferin, Anacin,  Ecotrin, aspirin gum, Goodies, BC headache powders, etc., after a T&A because it can promote bleeding.  Please check with our office before administering any other medication that may been prescribed by other doctors during the two week post-operative period. 7. If you happen to look in the mirror or into your childs mouth you will see white/gray patches on the back of the throat.  This is what a scab looks like in the mouth and is normal after having a T&A.  It will disappear once the tonsil area heals completely. However, it may cause a noticeable odor, and this too will disappear with time.     8. You or your child may experience ear pain after having a T&A.  This is called referred pain and comes from the throat, but it is felt in the ears.  Ear pain is quite common and expected.  It will usually go away after ten days.  There is usually nothing wrong with the ears, and it is primarily due to the healing area stimulating the nerve to the ear that runs along the side of the throat.  Use either the prescribed pain medicine or Tylenol as needed.  The throat tissues after a tonsillectomy are obviously sensitive.  Smoking around children who have had a tonsillectomy significantly increases the risk of bleeding.  DO NOT SMOKE!   Scopolamine skin patches REMOVE PATCH IN 72 HOURS AND WASH HANDS IMMEDIATELY What is this medicine? SCOPOLAMINE (skoe POL a meen) is used to prevent nausea and vomiting caused by motion sickness, anesthesia and surgery. This medicine may be used for other purposes; ask your health care provider or pharmacist if you have questions. COMMON BRAND NAME(S): Transderm Scop What should I tell my health care provider before I take this medicine? They need to know if you have any of these conditions: -are scheduled to have a gastric secretion test -glaucoma -heart disease -kidney disease -liver disease -lung or breathing disease, like asthma -mental illness -prostate  disease -seizures -stomach or intestine problems -trouble passing urine -an unusual or allergic reaction to scopolamine, atropine, other medicines, foods, dyes, or preservatives -pregnant or trying to get pregnant -breast-feeding How should I use this medicine? This medicine is for external use only. Follow the directions on the prescription label. Wear only 1 patch at a time. Choose an area behind the ear, that is clean, dry, hairless and free from any cuts or irritation. Wipe the area with a clean dry tissue. Peel off the plastic backing of the skin patch, trying not to touch the adhesive side with your hands. Do not cut the patches. Firmly apply to the area you have chosen, with the metallic side of the patch to the skin and the tan-colored side showing. Once firmly in place, wash your hands well with soap and water. Do not get this medicine into your eyes. After removing the patch, wash your hands and the area behind your ear thoroughly with soap and water. The  patch will still contain some medicine after use. To avoid accidental contact or ingestion by children or pets, fold the used patch in half with the sticky side together and throw away in the trash out of the reach of children and pets. If you need to use a second patch after you remove the first, place it behind the other ear. A special MedGuide will be given to you by the pharmacist with each prescription and refill. Be sure to read this information carefully each time. Talk to your pediatrician regarding the use of this medicine in children. Special care may be needed. Overdosage: If you think you have taken too much of this medicine contact a poison control center or emergency room at once. NOTE: This medicine is only for you. Do not share this medicine with others. What if I miss a dose? This does not apply. This medicine is not for regular use. What may interact with this medicine? -alcohol -antihistamines for allergy cough and  cold -atropine -certain medicines for anxiety or sleep -certain medicines for bladder problems like oxybutynin, tolterodine -certain medicines for depression like amitriptyline, fluoxetine, sertraline -certain medicines for stomach problems like dicyclomine, hyoscyamine -certain medicines for Parkinson's disease like benztropine, trihexyphenidyl -certain medicines for seizures like phenobarbital, primidone -general anesthetics like halothane, isoflurane, methoxyflurane, propofol -ipratropium -local anesthetics like lidocaine, pramoxine, tetracaine -medicines that relax muscles for surgery -phenothiazines like chlorpromazine, mesoridazine, prochlorperazine, thioridazine -narcotic medicines for pain -other belladonna alkaloids This list may not describe all possible interactions. Give your health care provider a list of all the medicines, herbs, non-prescription drugs, or dietary supplements you use. Also tell them if you smoke, drink alcohol, or use illegal drugs. Some items may interact with your medicine. What should I watch for while using this medicine? Limit contact with water while swimming and bathing because the patch may fall off. If the patch falls off, throw it away and put a new one behind the other ear. You may get drowsy or dizzy. Do not drive, use machinery, or do anything that needs mental alertness until you know how this medicine affects you. Do not stand or sit up quickly, especially if you are an older patient. This reduces the risk of dizzy or fainting spells. Alcohol may interfere with the effect of this medicine. Avoid alcoholic drinks. Your mouth may get dry. Chewing sugarless gum or sucking hard candy, and drinking plenty of water may help. Contact your healthcare professional if the problem does not go away or is severe. This medicine may cause dry eyes and blurred vision. If you wear contact lenses, you may feel some discomfort. Lubricating drops may help. See your  healthcare professional if the problem does not go away or is severe. If you are going to need surgery, an MRI, CT scan, or other procedure, tell your healthcare professional that you are using this medicine. You may need to remove the patch before the procedure. What side effects may I notice from receiving this medicine? Side effects that you should report to your doctor or health care professional as soon as possible: -allergic reactions like skin rash, itching or hives; swelling of the face, lips, or tongue -blurred vision -changes in vision -confusion -dizziness -eye pain -fast, irregular heartbeat -hallucinations, loss of contact with reality -nausea, vomiting -pain or trouble passing urine -restlessness -seizures -skin irritation -stomach pain Side effects that usually do not require medical attention (report to your doctor or health care professional if they continue or are bothersome): -drowsiness -dry  mouth -headache -sore throat This list may not describe all possible side effects. Call your doctor for medical advice about side effects. You may report side effects to FDA at 1-800-FDA-1088. Where should I keep my medicine? Keep out of the reach of children. Store at room temperature between 20 and 25 degrees C (68 and 77 degrees F). Keep this medicine in the foil package until ready to use. Throw away any unused medicine after the expiration date. NOTE: This sheet is a summary. It may not cover all possible information. If you have questions about this medicine, talk to your doctor, pharmacist, or health care provider.  2019 Elsevier/Gold Standard (2017-12-19 16:14:46)  General Anesthesia, Adult, Care After This sheet gives you information about how to care for yourself after your procedure. Your health care provider may also give you more specific instructions. If you have problems or questions, contact your health care provider. What can I expect after the  procedure? After the procedure, the following side effects are common:  Pain or discomfort at the IV site.  Nausea.  Vomiting.  Sore throat.  Trouble concentrating.  Feeling cold or chills.  Weak or tired.  Sleepiness and fatigue.  Soreness and body aches. These side effects can affect parts of the body that were not involved in surgery. Follow these instructions at home:  For at least 24 hours after the procedure:  Have a responsible adult stay with you. It is important to have someone help care for you until you are awake and alert.  Rest as needed.  Do not: ? Participate in activities in which you could fall or become injured. ? Drive. ? Use heavy machinery. ? Drink alcohol. ? Take sleeping pills or medicines that cause drowsiness. ? Make important decisions or sign legal documents. ? Take care of children on your own. Eating and drinking  Follow any instructions from your health care provider about eating or drinking restrictions.  When you feel hungry, start by eating small amounts of foods that are soft and easy to digest (bland), such as toast. Gradually return to your regular diet.  Drink enough fluid to keep your urine pale yellow.  If you vomit, rehydrate by drinking water, juice, or clear broth. General instructions  If you have sleep apnea, surgery and certain medicines can increase your risk for breathing problems. Follow instructions from your health care provider about wearing your sleep device: ? Anytime you are sleeping, including during daytime naps. ? While taking prescription pain medicines, sleeping medicines, or medicines that make you drowsy.  Return to your normal activities as told by your health care provider. Ask your health care provider what activities are safe for you.  Take over-the-counter and prescription medicines only as told by your health care provider.  If you smoke, do not smoke without supervision.  Keep all follow-up  visits as told by your health care provider. This is important. Contact a health care provider if:  You have nausea or vomiting that does not get better with medicine.  You cannot eat or drink without vomiting.  You have pain that does not get better with medicine.  You are unable to pass urine.  You develop a skin rash.  You have a fever.  You have redness around your IV site that gets worse. Get help right away if:  You have difficulty breathing.  You have chest pain.  You have blood in your urine or stool, or you vomit blood. Summary  After the procedure,  it is common to have a sore throat or nausea. It is also common to feel tired.  Have a responsible adult stay with you for the first 24 hours after general anesthesia. It is important to have someone help care for you until you are awake and alert.  When you feel hungry, start by eating small amounts of foods that are soft and easy to digest (bland), such as toast. Gradually return to your regular diet.  Drink enough fluid to keep your urine pale yellow.  Return to your normal activities as told by your health care provider. Ask your health care provider what activities are safe for you. This information is not intended to replace advice given to you by your health care provider. Make sure you discuss any questions you have with your health care provider. Document Released: 01/06/2001 Document Revised: 05/16/2017 Document Reviewed: 05/16/2017 Elsevier Interactive Patient Education  2019 [employer].

## 2018-11-26 ENCOUNTER — Encounter: Payer: Self-pay | Admitting: Otolaryngology

## 2018-11-26 MED ORDER — PREDNISONE 10 MG (21) PO TBPK: 10.0000 mg | ORAL_TABLET | Freq: Every day | ORAL | 0 refills | Status: DC

## 2018-11-26 MED ORDER — ACETAMINOPHEN 325 MG PO TABS: 650.0000 mg | ORAL_TABLET | Freq: Four times a day (QID) | ORAL | Status: AC | PRN

## 2018-11-26 MED ORDER — AMOXICILLIN-POT CLAVULANATE 875-125 MG PO TABS
1.0000 | ORAL_TABLET | Freq: Two times a day (BID) | ORAL | Status: DC
  Administered 2018-11-26: 1 via ORAL
  Filled 2018-11-26: qty 1

## 2018-11-26 MED ORDER — AMOXICILLIN-POT CLAVULANATE 875-125 MG PO TABS: 1.0000 | ORAL_TABLET | Freq: Two times a day (BID) | ORAL | 0 refills | Status: DC

## 2018-11-26 NOTE — Progress Notes (Signed)
Discharge instructions and prescriptions for prednisone and Augmentin given to patient with verbalization of understanding. IV removed x2 without difficulty. All questions answered. Patient wheeled outside via nursing to her mother.

## 2018-11-26 NOTE — Care Management (Signed)
Called over to [facility] to notify of the inbox referral, [phone] She stated that they will be able to help with the medcations, Requested that I fax a face sheet and the RX. To [phone], fax sent

## 2018-11-26 NOTE — Care Management Note (Signed)
Case Management Note  Patient Details  Name: Leslie Kerr MRN: [id number] Date of Birth: [birth year]  Subjective/Objective:                   Met with patient and her mother in the room to discuss DC plan and needs She does not have insurance I sent a referal via secure inbox to Langley Adie at [facility] requesting assistance for [place] and Prednisone I provided a resource list for local clinics for PCP   Action/Plan: Provided resource list for PCP and [facility] referal via inbox for  DC meds  Expected Discharge Date:  11/26/18               Expected Discharge Plan:     In-House Referral:     Discharge planning Services  CM Consult  Post Acute Care Choice:    Choice offered to:     DME Arranged:    DME Agency:     HH Arranged:    [place] Agency:     Status of Service:  Completed, signed off  If discussed at [employer], dates discussed:    Additional Comments:  Su Hilt, RN 11/26/2018, 10:07 AM

## 2018-11-26 NOTE — Discharge Instructions (Signed)
Follow-up with primary care physician in 3 to 4 days Follow-up with ENT Dr. Andee Poles, they will schedule a follow-up appointment

## 2018-11-26 NOTE — Discharge Summary (Signed)
[facility] - [place] at [facility]   PATIENT NAME: Leslie Kerr    MR#:  [id number]  DATE OF BIRTH:  1995-11-13  DATE OF ADMISSION:  11/25/2018 ADMITTING PHYSICIAN: Enedina Finner, MD  DATE OF DISCHARGE: 11/26/2018  PRIMARY CARE PHYSICIAN: Patient, No Pcp Per    ADMISSION DIAGNOSIS:  Pneumonitis [J18.9] Acute respiratory failure with hypoxia (HCC) [J96.01]  DISCHARGE DIAGNOSIS:  Active Problems:   Acute respiratory distress Aspiration pneumonitis Tonsillectomy  SECONDARY DIAGNOSIS:   Past Medical History:  Diagnosis Date  . Kidney infection     HOSPITAL COURSE:  HPI  Leslie Kerr  is a 22 y.o. female with a known history of chronic tonsillitis and throat infection underwent tonsillectomy at [facility] outpatient surgery by Dr.Vaught ENT. Post extubation patient was noted to be hypoxic on room air. She received breathing treatment was placed on oxygen and given incentive spirometer. Patient continued to remain sats in the 80s. Chest x-ray was done which showed possible infiltrate versus atelectasis.  Patient was sent to ER by ambulance. Currently she is tachycardic. Her stats are 93 to 94% on 3 L nasal cannula. She denies any respiratory distress.  CT chest was done which shows ground glass opacity's and upper lobes with possibility of aspiration pneumonitis. Patient received a dose of IV unasyn.  She is being admitted for further evaluation of management.  Hospital course  . Acute hypoxic respiratory failure suspected due to pneumonitis likely aspiration post extubation s/p tonsillectomy -Patient clinically doing much better, oxygen weaned off to room air -IV fluids given -Patient is tolerating soft diet -patient advised not to use straw to drink fluids -IV Solu-Medrol 60 mg daily given, changed to p.o. prednisone -IV antibiotics--Unasyn was provided during the hospital course will discharge with p.o. Augmentin -incentive spirometer -PRN  nebs -Discussed  with Dr. Andee Poles  agreeable with the current plan.  Okay to discharge patient  2. Status post tonsillectomy -per ENT -PRN pain meds   3. History of tobacco abuse/vaping advised not to smoke or vape-- she voiced understanding  discussed with Dr. Andee Poles  Patient and mom are agreeable  DISCHARGE CONDITIONS:   Stable  CONSULTS OBTAINED:  Treatment Team:  Bud Face, MD   PROCEDURES tonsillectomy 11/25/2018  DRUG ALLERGIES:  No Known Allergies  DISCHARGE MEDICATIONS:   Allergies as of 11/26/2018   No Known Allergies     Medication List    TAKE these medications   acetaminophen 325 MG tablet Commonly known as:  TYLENOL Take 2 tablets (650 mg total) by mouth every 6 (six) hours as needed for mild pain (or Fever >/= 101).   amoxicillin-clavulanate 875-125 MG tablet Commonly known as:  AUGMENTIN Take 1 tablet by mouth every 12 (twelve) hours.   lidocaine 2 % solution Commonly known as:  XYLOCAINE Use as directed 10 mLs in the mouth or throat every 6 (six) hours as needed for mouth pain (Swish and spit).   norethindrone-ethinyl estradiol 1-20 MG-MCG tablet Commonly known as:  JUNEL FE,GILDESS FE,LOESTRIN FE Take 1 tablet by mouth daily.   ondansetron 4 MG tablet Commonly known as:  ZOFRAN Take 1 tablet (4 mg total) by mouth every 8 (eight) hours as needed for up to 10 doses for nausea or vomiting.   oxyCODONE 5 MG/5ML solution Commonly known as:  ROXICODONE Take 10 mLs (10 mg total) by mouth every 6 (six) hours as needed for up to 7 days for severe pain.   predniSONE 10 MG (21) Tbpk tablet Commonly known  as:  STERAPRED UNI-PAK 21 TAB Take 1 tablet (10 mg total) by mouth daily. Take 6 tablets by mouth for 1 day followed by  5 tablets by mouth for 1 day followed by  4 tablets by mouth for 1 day followed by  3 tablets by mouth for 1 day followed by  2 tablets by mouth for 1 day followed by  1 tablet by mouth for a day and stop         DISCHARGE INSTRUCTIONS:  Follow-up with primary care physician in 3 to 4 days Follow-up with ENT Dr. Andee Poles, they will schedule a follow-up appointment   DIET:  Soft diet as tolerated  DISCHARGE CONDITION:  Stable  ACTIVITY:  Activity as tolerated  OXYGEN:  Home Oxygen: No.   Oxygen Delivery: room air  DISCHARGE LOCATION:  home   If you experience worsening of your admission symptoms, develop shortness of breath, life threatening emergency, suicidal or homicidal thoughts you must seek medical attention immediately by calling 911 or calling your MD immediately  if symptoms less severe.  You Must read complete instructions/literature along with all the possible adverse reactions/side effects for all the Medicines you take and that have been prescribed to you. Take any new Medicines after you have completely understood and accpet all the possible adverse reactions/side effects.   Please note  You were cared for by a hospitalist during your hospital stay. If you have any questions about your discharge medications or the care you received while you were in the hospital after you are discharged, you can call the unit and asked to speak with the hospitalist on call if the hospitalist that took care of you is not available. Once you are discharged, your primary care physician will handle any further medical issues. Please note that NO REFILLS for any discharge medications will be authorized once you are discharged, as it is imperative that you return to your primary care physician (or establish a relationship with a primary care physician if you do not have one) for your aftercare needs so that they can reassess your need for medications and monitor your lab values.     Today  Chief Complaint  Patient presents with  . Shortness of Breath  . Sore Throat  . Post-op Problem   Patient is feeling much better tolerating soft diet, oxygen weaned off, pain is manageable with current  pain medications  ROS:  CONSTITUTIONAL: Denies fevers, chills. Denies any fatigue, weakness.  EYES: Denies blurry vision, double vision, eye pain. EARS, NOSE, THROAT: Denies tinnitus, ear pain, hearing loss.  Some throat pain but manageable with the current pain medications RESPIRATORY: Denies cough, wheeze, shortness of breath.  CARDIOVASCULAR: Denies chest pain, palpitations, edema.  GASTROINTESTINAL: Denies nausea, vomiting, diarrhea, abdominal pain. Denies bright red blood per rectum. GENITOURINARY: Denies dysuria, hematuria. ENDOCRINE: Denies nocturia or thyroid problems. HEMATOLOGIC AND LYMPHATIC: Denies easy bruising or bleeding. SKIN: Denies rash or lesion. MUSCULOSKELETAL: Denies pain in neck, back, shoulder, knees, hips or arthritic symptoms.  NEUROLOGIC: Denies paralysis, paresthesias.  PSYCHIATRIC: Denies anxiety or depressive symptoms.   VITAL SIGNS:  Blood pressure (!) 99/51, pulse 86, temperature 97.8 F (36.6 C), temperature source Oral, resp. rate 17, height 5\' 6"  (1.676 m), weight 99 kg, last menstrual period 11/24/2018, SpO2 93 %.  I/O:  No intake or output data in the 24 hours ending 11/26/18 0957  PHYSICAL EXAMINATION:  GENERAL:  23 y.o.-year-old patient lying in the bed with no acute distress.  EYES: Pupils  equal, round, reactive to light and accommodation. No scleral icterus. Extraocular muscles intact.  HEENT: Head atraumatic, normocephalic. Oropharynx and nasopharynx clear.  Status post tonsillectomy NECK:  Supple, no jugular venous distention. No thyroid enlargement, no tenderness.  LUNGS: Normal breath sounds bilaterally, no wheezing, rales,rhonchi or crepitation. No use of accessory muscles of respiration.  CARDIOVASCULAR: S1, S2 normal. No murmurs, rubs, or gallops.  ABDOMEN: Soft, non-tender, non-distended. Bowel sounds present.  EXTREMITIES: No pedal edema, cyanosis, or clubbing.  NEUROLOGIC: Awake alert and oriented x3. Sensation intact. Gait not  checked.  PSYCHIATRIC: The patient is alert and oriented x 3.  SKIN: No obvious rash, lesion, or ulcer.   DATA REVIEW:   CBC Recent Labs  Lab 11/25/18 1453  WBC 14.7*  HGB 14.5  HCT 44.2  PLT 292    Chemistries  Recent Labs  Lab 11/25/18 1453  NA 136  K 3.9  CL 106  CO2 22  GLUCOSE 127*  BUN 11  CREATININE 0.65  CALCIUM 9.2    Cardiac Enzymes No results for input(s): TROPONINI in the last 168 hours.  Microbiology Results  Results for orders placed or performed during the hospital encounter of 12/26/17  Urine Culture     Status: Abnormal   Collection Time: 12/26/17 12:51 PM  Result Value Ref Range Status   Specimen Description   Final    URINE, RANDOM Performed at [facility], [address] [place], [place] [address]    Special Requests   Final    NONE Performed at [facility], [address] [place], [place] [address]    Culture >=100,000 COLONIES/mL ESCHERICHIA COLI (A)  Final   Report Status 12/28/2017 FINAL  Final   Organism ID, Bacteria ESCHERICHIA COLI (A)  Final      Susceptibility   Escherichia coli - MIC*    AMPICILLIN >=32 RESISTANT Resistant     CEFAZOLIN <=4 SENSITIVE Sensitive     CEFTRIAXONE <=1 SENSITIVE Sensitive     CIPROFLOXACIN <=0.25 SENSITIVE Sensitive     GENTAMICIN <=1 SENSITIVE Sensitive     IMIPENEM <=0.25 SENSITIVE Sensitive     NITROFURANTOIN 32 SENSITIVE Sensitive     TRIMETH/SULFA >=320 RESISTANT Resistant     AMPICILLIN/SULBACTAM 16 INTERMEDIATE Intermediate     PIP/TAZO <=4 SENSITIVE Sensitive     Extended ESBL NEGATIVE Sensitive     * >=100,000 COLONIES/mL ESCHERICHIA COLI  Blood Culture (routine x 2)     Status: None   Collection Time: 12/26/17  6:22 PM  Result Value Ref Range Status   Specimen Description BLOOD RIGHT ANTECUBITAL  Final   Special Requests   Final    BOTTLES DRAWN AEROBIC AND ANAEROBIC Blood Culture adequate volume   Culture   Final    NO GROWTH 5 DAYS Performed at [facility], [address] [place], [place] [address]    Report Status 12/31/2017 FINAL  Final  Blood Culture (routine x 2)     Status: Abnormal   Collection Time: 12/26/17  6:22 PM  Result Value Ref Range Status   Specimen Description   Final    BLOOD LEFT ANTECUBITAL Performed at [facility], [address] [place], [place] [address]    Special Requests   Final    BOTTLES DRAWN AEROBIC AND ANAEROBIC Blood Culture adequate volume Performed at [facility], [address], [place], [place] [address]    Culture  Setup Time   Final    GRAM NEGATIVE RODS ANAEROBIC  BOTTLE ONLY CRITICAL RESULT CALLED TO, READ BACK BY AND VERIFIED WITH: [facility] LPHEEMA HALLAJI 12/27/17 @ 0848  MLK Performed at [facility], [address] [place], [place] [address]    Culture ESCHERICHIA COLI (A)  Final   Report Status 12/29/2017 FINAL  Final   Organism ID, Bacteria ESCHERICHIA COLI  Final      Susceptibility   Escherichia coli - MIC*    AMPICILLIN >=32 RESISTANT Resistant     CEFAZOLIN <=4 SENSITIVE Sensitive     CEFEPIME <=1 SENSITIVE Sensitive     CEFTAZIDIME <=1 SENSITIVE Sensitive     CEFTRIAXONE <=1 SENSITIVE Sensitive     CIPROFLOXACIN <=0.25 SENSITIVE Sensitive     GENTAMICIN <=1 SENSITIVE Sensitive     IMIPENEM <=0.25 SENSITIVE Sensitive     TRIMETH/SULFA >=320 RESISTANT Resistant     AMPICILLIN/SULBACTAM 16 INTERMEDIATE Intermediate     PIP/TAZO <=4 SENSITIVE Sensitive     Extended ESBL NEGATIVE Sensitive     * ESCHERICHIA COLI  Blood Culture ID Panel (Reflexed)     Status: Abnormal   Collection Time: 12/26/17  6:22 PM  Result Value Ref Range Status   Enterococcus species NOT DETECTED NOT DETECTED Final   Listeria monocytogenes NOT DETECTED NOT DETECTED Final   Staphylococcus species NOT DETECTED NOT DETECTED Final   Staphylococcus aureus (BCID) NOT DETECTED NOT DETECTED Final   Streptococcus species NOT DETECTED NOT DETECTED Final   Streptococcus agalactiae  NOT DETECTED NOT DETECTED Final   Streptococcus pneumoniae NOT DETECTED NOT DETECTED Final   Streptococcus pyogenes NOT DETECTED NOT DETECTED Final   Acinetobacter baumannii NOT DETECTED NOT DETECTED Final   Enterobacteriaceae species DETECTED (A) NOT DETECTED Final    Comment: Enterobacteriaceae represent a large family of gram-negative bacteria, not a single organism. CRITICAL RESULT CALLED TO, READ BACK BY AND VERIFIED WITH: SHEEMA HALLAJI 12/27/17 @ 0848 MLK    Enterobacter cloacae complex NOT DETECTED NOT DETECTED Final   Escherichia coli DETECTED (A) NOT DETECTED Final    Comment: CRITICAL RESULT CALLED TO, READ BACK BY AND VERIFIED WITH: SHEEMA HALLAJI 12/27/17 @ 0848  MLK    Klebsiella oxytoca NOT DETECTED NOT DETECTED Final   Klebsiella pneumoniae NOT DETECTED NOT DETECTED Final   Proteus species NOT DETECTED NOT DETECTED Final   Serratia marcescens NOT DETECTED NOT DETECTED Final   Carbapenem resistance NOT DETECTED NOT DETECTED Final   Haemophilus influenzae NOT DETECTED NOT DETECTED Final   Neisseria meningitidis NOT DETECTED NOT DETECTED Final   Pseudomonas aeruginosa NOT DETECTED NOT DETECTED Final   Candida albicans NOT DETECTED NOT DETECTED Final   Candida glabrata NOT DETECTED NOT DETECTED Final   Candida krusei NOT DETECTED NOT DETECTED Final   Candida parapsilosis NOT DETECTED NOT DETECTED Final   Candida tropicalis NOT DETECTED NOT DETECTED Final    Comment: Performed at [facility], [address] [place], [place] [address]    RADIOLOGY:  Dg Chest 2 View  Result Date: 11/25/2018 CLINICAL DATA:  Hypoxia, rales, and wheezing. Status post tonsillectomy today. EXAM: CHEST - 2 VIEW COMPARISON:  11/25/2018 FINDINGS: Low lung volumes are noted. Mild atelectasis or infiltrates are seen in both lung bases. No evidence of pleural effusion. Heart size and mediastinal contours are within normal limits. IMPRESSION: Low lung volumes with mild bibasilar atelectasis  versus infiltrates. Electronically Signed   By: Myles RosenthalJohn  Stahl M.D.   On: 11/25/2018 13:42   Ct Angio Chest Pe W And/or Wo Contrast  Result Date: 11/25/2018 CLINICAL DATA:  23 year old female with hypoxia and shortness of breath following tonsil surgery today. EXAM: CT ANGIOGRAPHY CHEST WITH CONTRAST TECHNIQUE: Multidetector CT imaging of the chest was performed using the standard protocol during bolus administration of intravenous contrast. Multiplanar CT image reconstructions and MIPs were obtained to evaluate the vascular anatomy. CONTRAST:  75mL OMNIPAQUE IOHEXOL 350 MG/ML SOLN COMPARISON:  None. FINDINGS: Cardiovascular: This is a technically borderline study due to respiratory motion artifact. No pulmonary emboli are identified. Heart size normal. No thoracic aortic aneurysm or pericardial effusion. Mediastinum/Nodes: No enlarged mediastinal, hilar, or axillary lymph nodes. Thyroid gland, trachea, and esophagus demonstrate no significant findings. Lungs/Pleura: Moderate bilateral ground-glass and airspace opacities identified, greatest in the posterior UPPER lobes and throughout both LOWER lobes. No mass, pleural effusion or pneumothorax identified. Upper Abdomen: No acute abnormality. Musculoskeletal: No acute or suspicious abnormality. Review of the MIP images confirms the above findings. IMPRESSION: 1. Moderate bilateral ground-glass and airspace opacities, nonspecific but favor edema over infectious/inflammatory causes given history. 2. No definite pulmonary emboli, but sensitivity slightly decreased secondary to respiratory motion artifact. Electronically Signed   By: Harmon PierJeffrey  Hu M.D.   On: 11/25/2018 16:03   Dg Chest Port 1 View  Result Date: 11/25/2018 CLINICAL DATA:  Decreased oxygen saturation EXAM: PORTABLE CHEST 1 VIEW COMPARISON:  None. FINDINGS: There is ill-defined opacity in the lateral left base concerning for early pneumonia. Lungs elsewhere are clear. Heart size and pulmonary vascularity  are normal. No adenopathy. No bone lesions. IMPRESSION: Rather subtle ill-defined opacity left base, concerning for early pneumonia. Lungs elsewhere clear. Cardiac silhouette within normal limits. These results will be called to the ordering clinician or representative by the Radiologist Assistant, and communication documented in the PACS or zVision Dashboard. Electronically Signed   By: Bretta BangWilliam  Woodruff III M.D.   On: 11/25/2018 11:21    EKG:   Orders placed or performed during the hospital encounter of 01/13/18  . EKG 12-Lead  . EKG 12-Lead  . ED EKG within 10 minutes  . ED EKG within 10 minutes  . EKG      Management plans discussed with the patient, mom at bedside and they are in agreement.  CODE STATUS:     Code Status Orders  (From admission, onward)         Start     Ordered   11/25/18 1857  Full code  Continuous     11/25/18 1856        Code Status History    Date Active Date Inactive Code Status Order ID Comments User Context   12/26/2017 1826 12/28/2017 1455 Full Code [id number]  Milagros LollSudini, Srikar, MD ED      TOTAL TIME TAKING CARE OF THIS PATIENT: 41 minutes.   Note: This dictation was prepared with Dragon dictation along with smaller phrase technology. Any transcriptional errors that result from this process are unintentional.   @MEC @  on 11/26/2018 at 9:57 AM  Between 7am to 6pm - Pager - [phone]  After 6pm go to [email or web address] - password [email or web address]  [facility]  Hospitalists  Office  [phone]  CC: Primary care physician; Patient, No Pcp Per

## 2018-11-27 LAB — SURGICAL PATHOLOGY

## 2018-12-02 ENCOUNTER — Telehealth: Payer: Self-pay | Admitting: Licensed Clinical Social Worker

## 2018-12-02 NOTE — Telephone Encounter (Signed)
EMMI flagged patient for answering yes to loss of interest in things. Clinical Child psychotherapist (CSW) contacted patient via telephone. Patient scored 3 on PHQ-9. Per patient she is doing much better and is feeling better. Patient reported that she is moving around normally. Patient reported that she is feeling a little sad but it has gotten better. Patient reported that she is not having thoughts of hurting herself. CSW provided patient with contact information for [facility] clinic. Patient reported that she would go to [facility] on Friday this week. Patient reported no needs or concerns.   [employer], LCSW [phone]

## 2019-07-28 ENCOUNTER — Other Ambulatory Visit: Payer: Self-pay

## 2019-07-28 DIAGNOSIS — Z20822 Contact with and (suspected) exposure to covid-19: Secondary | ICD-10-CM

## 2019-07-30 LAB — NOVEL CORONAVIRUS, NAA: SARS-CoV-2, NAA: NOT DETECTED

## 2020-10-31 IMAGING — CT CT ANGIO CHEST
2 of 6 series · 18 of 46 positions shown · IV contrast (APPLIED)
Comparison: None.

CLINICAL DATA: 23-year-old female with hypoxia and shortness of
breath following tonsil surgery today.

EXAM:
CT ANGIOGRAPHY CHEST WITH CONTRAST
TECHNIQUE: Multidetector CT imaging of the chest was performed using the
standard protocol during bolus administration of intravenous
contrast. Multiplanar CT image reconstructions and MIPs were
obtained to evaluate the vascular anatomy.
CONTRAST:  75mL OMNIPAQUE IOHEXOL 350 MG/ML SOLN

[Series 5: thins · axial · 0.68mm/px · z∈[-774,-521]mm · 15 of 279 slices shown]
[im 13/279  lung]
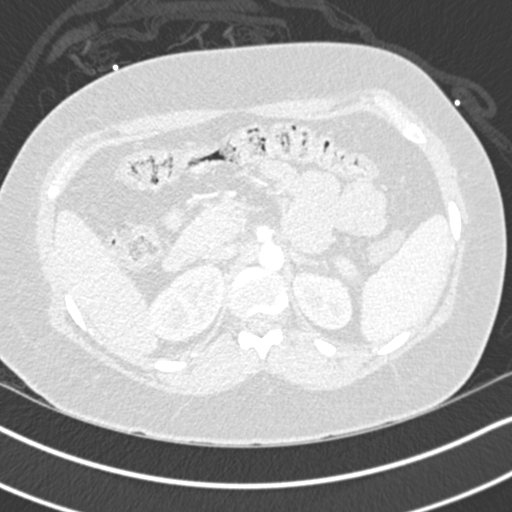
[im 37/279  soft-tissue]
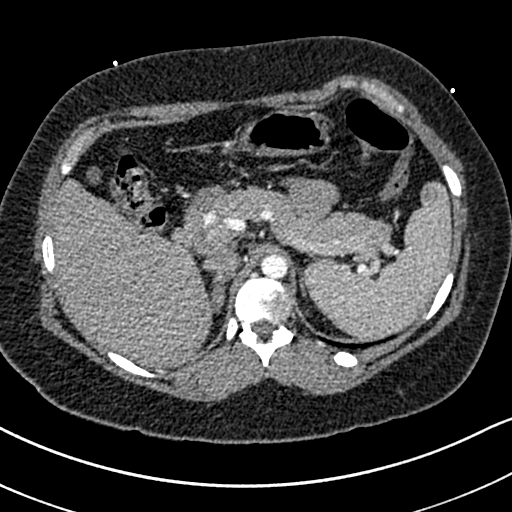
[im 49/279  lung]
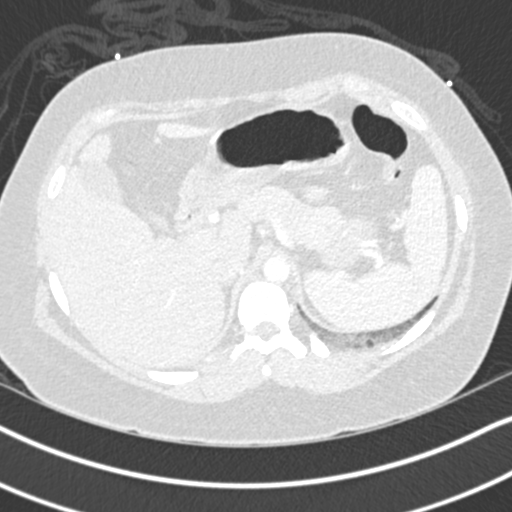
[im 73/279  soft-tissue]
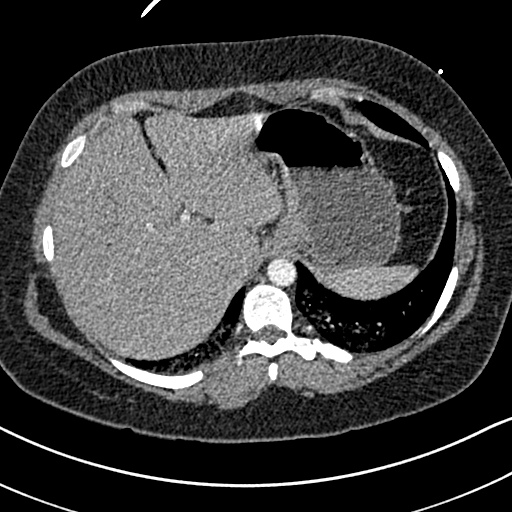
[im 85/279  lung]
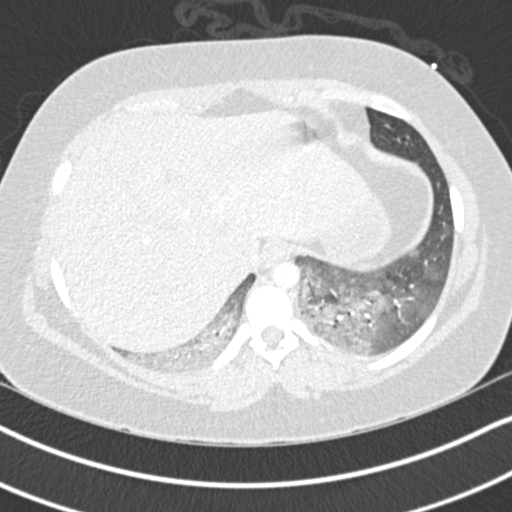
[im 109/279  soft-tissue]
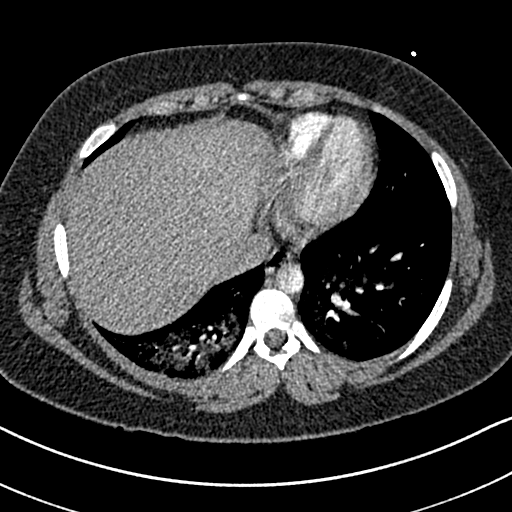
[im 121/279  lung]
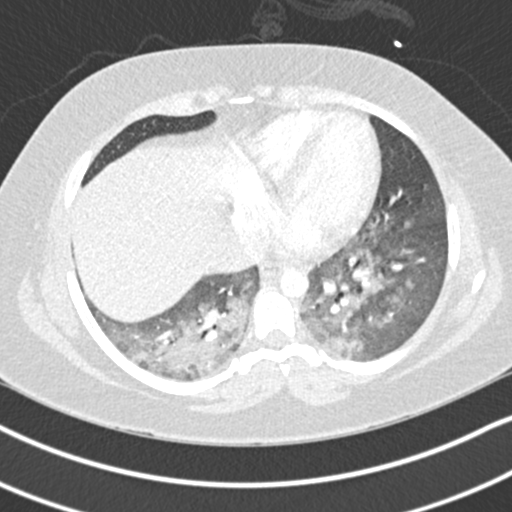
[im 146/279  soft-tissue]
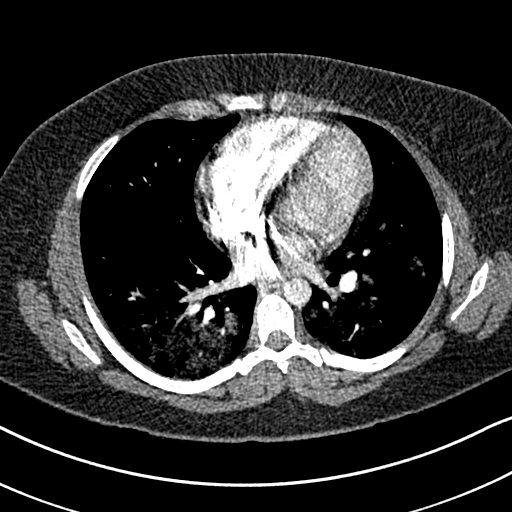
[im 158/279  lung]
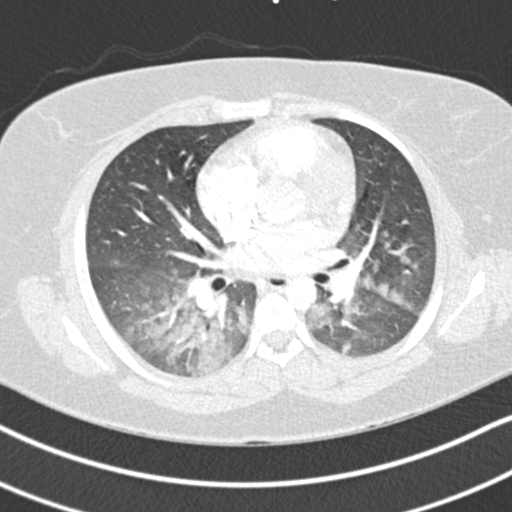
[im 170/279  soft-tissue]
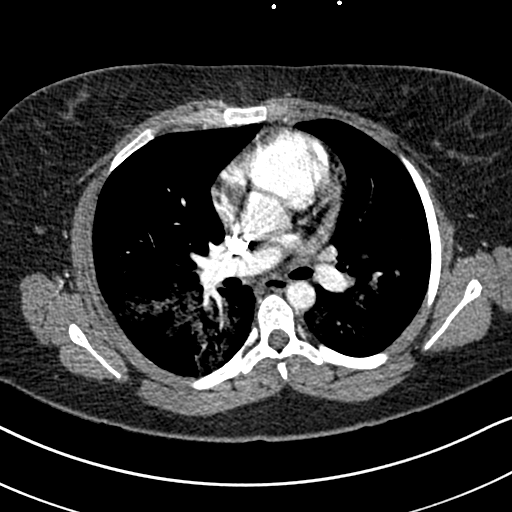
[im 194/279  lung]
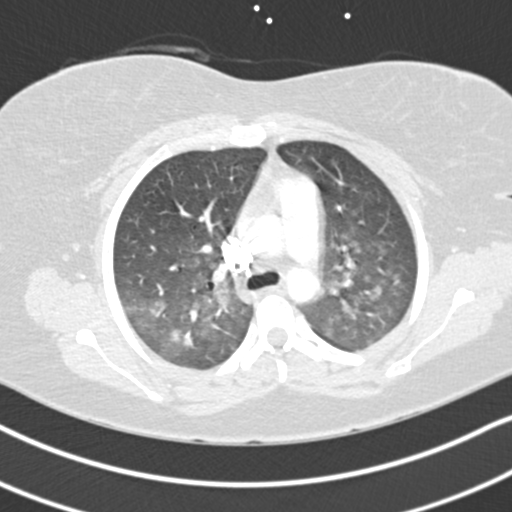
[im 206/279  soft-tissue]
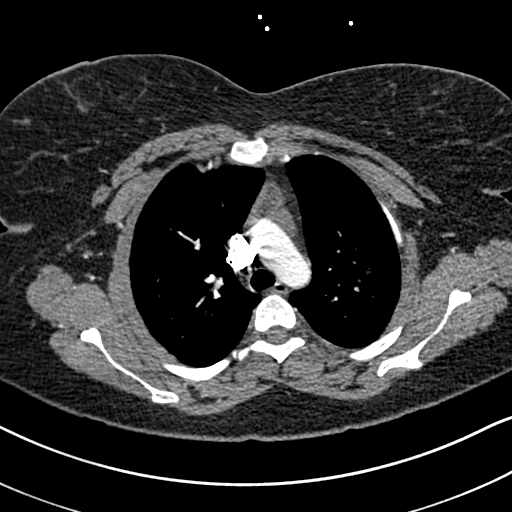
[im 230/279  lung]
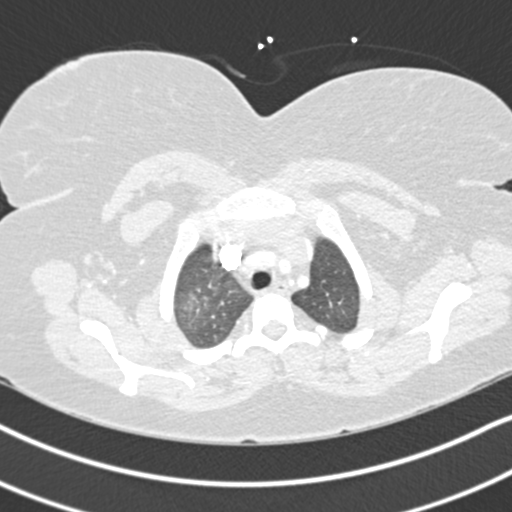
[im 242/279  soft-tissue]
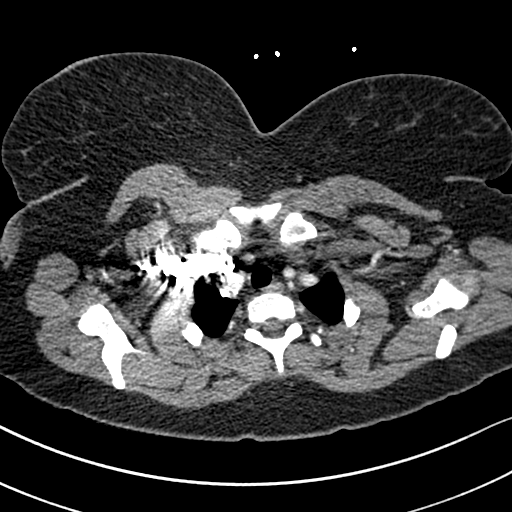
[im 266/279  lung]
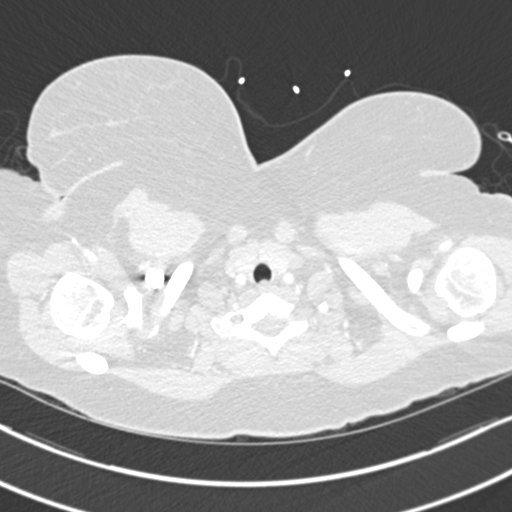

[Series 7: coronal mpr · coronal · 0.54mm/px · 3 of 106 slices shown]
[im 27/106  soft-tissue]
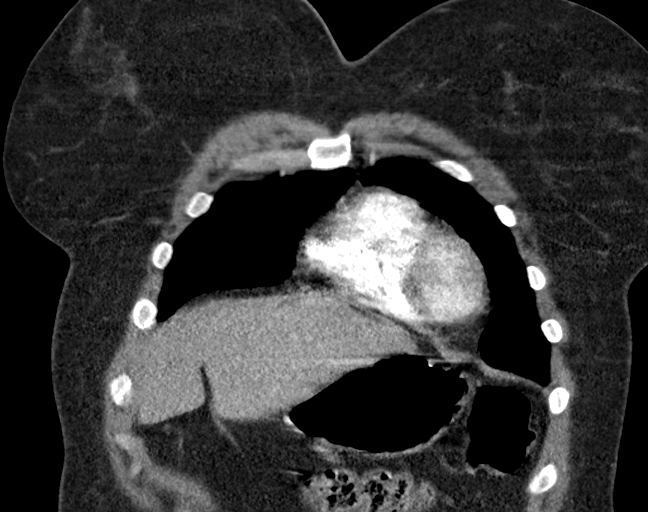
[im 53/106  soft-tissue]
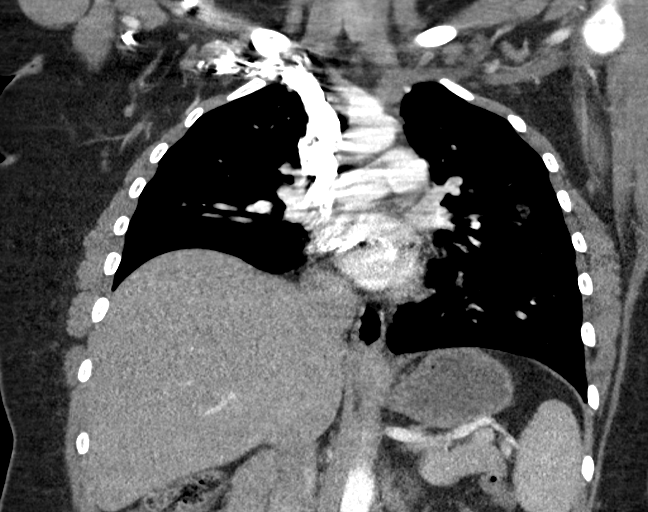
[im 79/106  soft-tissue]
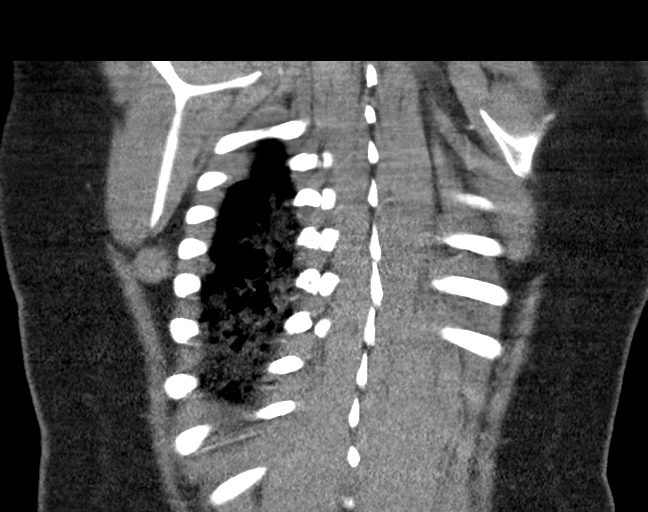

[18 of 46 positions shown; findings below may reference images not displayed]

FINDINGS: Cardiovascular: This is a technically borderline study due to
respiratory motion artifact. No pulmonary emboli are identified.

Heart size normal. No thoracic aortic aneurysm or pericardial
effusion.

Mediastinum/Nodes: No enlarged mediastinal, hilar, or axillary lymph
nodes. Thyroid gland, trachea, and esophagus demonstrate no
significant findings.

Lungs/Pleura: Moderate bilateral ground-glass and airspace opacities
identified, greatest in the posterior UPPER lobes and throughout
both LOWER lobes. No mass, pleural effusion or pneumothorax
identified.

Upper Abdomen: No acute abnormality.

Musculoskeletal: No acute or suspicious abnormality.

Review of the MIP images confirms the above findings.
IMPRESSION: 1. Moderate bilateral ground-glass and airspace opacities,
nonspecific but favor edema over infectious/inflammatory causes
given history.
2. No definite pulmonary emboli, but sensitivity slightly decreased
secondary to respiratory motion artifact.

## 2022-05-08 ENCOUNTER — Ambulatory Visit
Admission: RE | Admit: 2022-05-08 | Discharge: 2022-05-08 | Disposition: A | Discharge status: Home / Self Care | Payer: BC Managed Care – PPO | Source: Ambulatory Visit | Attending: Physician Assistant | Admitting: Physician Assistant

## 2022-05-08 ENCOUNTER — Other Ambulatory Visit: Payer: Self-pay

## 2022-05-08 VITALS — BP 114/86 | HR 90 | Temp 98.1°F | Resp 16 | Ht 66.0 in | Wt 218.3 lb

## 2022-05-08 DIAGNOSIS — B3731 Acute candidiasis of vulva and vagina: Secondary | ICD-10-CM

## 2022-05-08 DIAGNOSIS — N941 Unspecified dyspareunia: Secondary | ICD-10-CM | POA: Diagnosis not present

## 2022-05-08 DIAGNOSIS — N939 Abnormal uterine and vaginal bleeding, unspecified: Secondary | ICD-10-CM

## 2022-05-08 DIAGNOSIS — N760 Acute vaginitis: Secondary | ICD-10-CM

## 2022-05-08 DIAGNOSIS — B9689 Other specified bacterial agents as the cause of diseases classified elsewhere: Secondary | ICD-10-CM

## 2022-05-08 LAB — URINALYSIS, ROUTINE W REFLEX MICROSCOPIC
Bilirubin Urine: NEGATIVE
Glucose, UA: NEGATIVE mg/dL
Ketones, ur: NEGATIVE mg/dL
Nitrite: NEGATIVE
Protein, ur: NEGATIVE mg/dL
Specific Gravity, Urine: 1.025 (ref 1.005–1.030)
pH: 5.5 (ref 5.0–8.0)

## 2022-05-08 LAB — URINALYSIS, MICROSCOPIC (REFLEX)

## 2022-05-08 LAB — WET PREP, GENITAL
Sperm: NONE SEEN
Trich, Wet Prep: NONE SEEN
WBC, Wet Prep HPF POC: >10 — AB (ref <10)

## 2022-05-08 LAB — PREGNANCY, URINE: Preg Test, Ur: NEGATIVE

## 2022-05-08 MED ORDER — FLUCONAZOLE 150 MG PO TABS: ORAL_TABLET | ORAL | 0 refills | Status: AC

## 2022-05-08 MED ORDER — METRONIDAZOLE 500 MG PO TABS: 500.0000 mg | ORAL_TABLET | Freq: Two times a day (BID) | ORAL | 0 refills | Status: AC

## 2022-05-08 NOTE — Discharge Instructions (Addendum)
-  You have bacterial infection and yeast infection.  This can cause some irregular bleeding.  Hopefully the medications help. - I did place a referral to OB/GYN for you in case you continue to have irregular and/or heavy vaginal bleeding. - May take Midol for pain and discomfort and continue with warm compresses. - Your urine was a little abnormal so we are sending it for culture.  We will call you if you need any additional antibiotics. - Please go to emergency department if you feel significantly fatigued, weak, dizzy or have worsening pelvic pain or heavier vaginal bleeding.

## 2022-05-08 NOTE — ED Triage Notes (Addendum)
Pt c/o vaginal bleeding. She states she has had her 3rd period for this month. She states she has had more lower abdominal cramping this time, and heavier flow. She has been bleeding for 2 weeks. She states she has only had 2-3 days this month without any blood. Pt is currently sexually active with no contraception.

## 2022-05-08 NOTE — ED Provider Notes (Signed)
[facility]    CSN: [id number] Arrival date & time: 05/08/22  1041      History   Chief Complaint Chief Complaint  Patient presents with   Vaginal Bleeding    HPI Leslie Kerr is a 26 y.o. female presenting for irregular menstrual period.  Patient says she has had vaginal bleeding for the third time this month.  Reports she normally has a menstrual period about once every month and last 5 to 6 days.  States that she had her regular period earlier this month at about the same time it lasted for a couple days longer and was a bit heavier.  Reports changing a super tampon 8 times a day.  Patient says she came off her period for a few days and then had another menstrual period after she had intercourse with her partner.  She says the intercourse was painful but they did not do anything out of the ordinary.  Denies it being any more rough than normal and denies any position changes.  Patient says the second time she had vaginal bleeding that lasted for several days and was also heavy.  And then the same thing happened again after having sex with her partner a couple days ago.  Patient has had bleeding for the past couple of days which is also heavy.  She does have some menstrual cramps which she says is no more painful than normal.  Gets better with ibuprofen.  No vaginal discharge or odor.  Denies dysuria, frequency or urgency.  No flank pain.  Denies concern for STIs.  Reports she has been with the same partner for 5 years.  She says they do not use condoms.  He says he does perform the pullout method.  She reports that she has tried birth control but she does not like it.  Has tried various methods.  Does not presently have a GYN.  No history of any GYN issues to her knowledge.  HPI  Past Medical History:  Diagnosis Date   Kidney infection     Patient Active Problem List   Diagnosis Date Noted   Acute respiratory distress 11/25/2018   Lightheadedness 01/13/2018    Pyelonephritis 12/26/2017    Past Surgical History:  Procedure Laterality Date   NO PAST SURGERIES     TONSILLECTOMY AND ADENOIDECTOMY Bilateral 11/25/2018   Procedure: TONSILLECTOMY;  Surgeon: Bud Face, MD;  Location: [facility];  Service: ENT;  Laterality: Bilateral;  no adenoids per MD    OB History   No obstetric history on file.      Home Medications    Prior to Admission medications   Medication Sig Start Date End Date Taking? Authorizing Provider  fluconazole (DIFLUCAN) 150 MG tablet Take 1 tablet p.o. today and repeat in 72 hours 05/08/22  Yes Eusebio Friendly B, PA-C  metroNIDAZOLE (FLAGYL) 500 MG tablet Take 1 tablet (500 mg total) by mouth 2 (two) times daily for 7 days. 05/08/22 05/15/22 Yes Shirlee Latch, PA-C  acetaminophen (TYLENOL) 325 MG tablet Take 2 tablets (650 mg total) by mouth every 6 (six) hours as needed for mild pain (or Fever >/= 101). 11/26/18   Ramonita Lab, MD    Family History No family history on file.  Social History Social History   Tobacco Use   Smoking status: Former    Years: 2.00    Types: Cigarettes    Quit date: 08/2018    Years since quitting: 3.7   Smokeless tobacco: Never  Vaping Use   Vaping Use: Former   Substances: Nicotine, Flavoring  Substance Use Topics   Alcohol use: Yes    Comment: 1-2x/month   Drug use: No     Allergies   Patient has no known allergies.   Review of Systems Review of Systems  Constitutional:  Negative for fatigue and fever.  Gastrointestinal:  Negative for abdominal pain, nausea and vomiting.  Genitourinary:  Positive for dyspareunia, menstrual problem, pelvic pain and vaginal bleeding. Negative for difficulty urinating, dysuria, flank pain, frequency, vaginal discharge and vaginal pain.     Physical Exam Triage Vital Signs ED Triage Vitals  Enc Vitals Group     BP 05/08/22 1107 114/86     Pulse Rate 05/08/22 1107 90     Resp 05/08/22 1107 16     Temp 05/08/22 1107 98.1 F  (36.7 C)     Temp Source 05/08/22 1107 Oral     SpO2 05/08/22 1107 97 %     Weight 05/08/22 1105 218 lb 4.1 oz (99 kg)     Height 05/08/22 1105 5\' 6"  (1.676 m)     Head Circumference --      Peak Flow --      Pain Score 05/08/22 1104 6     Pain Loc --      Pain Edu? --      Excl. in GC? --    No data found.  Updated Vital Signs BP 114/86 (BP Location: Left Arm)   Pulse 90   Temp 98.1 F (36.7 C) (Oral)   Resp 16   Ht 5\' 6"  (1.676 m)   Wt 218 lb 4.1 oz (99 kg)   LMP 04/24/2022   SpO2 97%   BMI 35.23 kg/m    Physical Exam Vitals and nursing note reviewed.  Constitutional:      General: She is not in acute distress.    Appearance: Normal appearance. She is not ill-appearing or toxic-appearing.  HENT:     Head: Normocephalic and atraumatic.  Eyes:     General: No scleral icterus.       Right eye: No discharge.        Left eye: No discharge.     Conjunctiva/sclera: Conjunctivae normal.  Cardiovascular:     Rate and Rhythm: Normal rate and regular rhythm.     Heart sounds: Normal heart sounds.  Pulmonary:     Effort: Pulmonary effort is normal. No respiratory distress.     Breath sounds: Normal breath sounds.  Abdominal:     Palpations: Abdomen is soft.     Tenderness: There is abdominal tenderness (suprapubic, RLQ, LLQ). There is no right CVA tenderness or left CVA tenderness.  Musculoskeletal:     Cervical back: Neck supple.  Skin:    General: Skin is dry.  Neurological:     General: No focal deficit present.     Mental Status: She is alert. Mental status is at baseline.     Motor: No weakness.     Gait: Gait normal.  Psychiatric:        Mood and Affect: Mood normal.        Behavior: Behavior normal.        Thought Content: Thought content normal.      UC Treatments / Results  Labs (all labs ordered are listed, but only abnormal results are displayed) Labs Reviewed  WET PREP, GENITAL - Abnormal; Notable for the following components:      Result  Value  Yeast Wet Prep HPF POC PRESENT (*)    Clue Cells Wet Prep HPF POC PRESENT (*)    WBC, Wet Prep HPF POC >10 (*)    All other components within normal limits  URINALYSIS, ROUTINE W REFLEX MICROSCOPIC - Abnormal; Notable for the following components:   APPearance HAZY (*)    Hgb urine dipstick MODERATE (*)    Leukocytes,Ua TRACE (*)    All other components within normal limits  URINALYSIS, MICROSCOPIC (REFLEX) - Abnormal; Notable for the following components:   Bacteria, UA MANY (*)    All other components within normal limits  URINE CULTURE  PREGNANCY, URINE    EKG   Radiology No results found.  Procedures Procedures (including critical care time)  Medications Ordered in UC Medications - No data to display  Initial Impression / Assessment and Plan / UC Course  I have reviewed the triage vital signs and the nursing notes.  Pertinent labs & imaging results that were available during my care of the patient were reviewed by me and considered in my medical decision making (see chart for details).  26 year old female presenting for irregular and heavy vaginal bleeding this month.  Reports normally having periods the same time every month last for 5 to 6 days.  Now she has had 3 separate episodes of vaginal bleeding that lasted for about a week at a time.  Reports changing a super tampon 8 times a day.  Menstrual cramping also.  No history of GYN issues to her knowledge.  Reports some dyspareunia.  Vitals normal and stable.  Patient overall well-appearing.  On exam abdomen is soft shows tenderness to palpation in suprapubic region mostly and also of the left lower quadrant followed by the right lower quadrant.  No CVA tenderness.  Urine pregnancy test negative.  Urinalysis performed shows hazy urine with moderate blood and trace leukocytes, many bacteria.  We will send urine for culture and assess for possible UTI but she denies any urinary symptoms.  Wet prep ordered.   Positive BV and yeast.  We will treat metronidazole and Diflucan.  Will await urine culture before treating for UTI as she has no urinary symptoms.  The vaginal infections could be reason for her irregular vaginal bleeding but I am concerned about the heaviness in the dyspareunia/still [facility].  Reviewed ED precautions with her for any worsening symptoms.   Final Clinical Impressions(s) / UC Diagnoses   Final diagnoses:  Abnormal vaginal bleeding  Bacterial vaginosis  Vaginal yeast infection  Dyspareunia in female     Discharge Instructions      -You have bacterial infection and yeast infection.  This can cause some irregular bleeding.  Hopefully the medications help. - I did place a referral to OB/GYN for you in case you continue to have irregular and/or heavy vaginal bleeding. - May take Midol for pain and discomfort and continue with warm compresses. - Your urine was a little abnormal so we are sending it for culture.  We will call you if you need any additional antibiotics. - Please go to emergency department if you feel significantly fatigued, weak, dizzy or have worsening pelvic pain or heavier vaginal bleeding.     ED Prescriptions     Medication Sig Dispense Auth. Provider   metroNIDAZOLE (FLAGYL) 500 MG tablet Take 1 tablet (500 mg total) by mouth 2 (two) times daily for 7 days. 14 tablet Laurene Footman B, PA-C   fluconazole (DIFLUCAN) 150  MG tablet Take 1 tablet p.o. today and repeat in 72 hours 2 tablet Danton Clap, PA-C      PDMP not reviewed this encounter.   Danton Clap, PA-C 05/08/22 1222

## 2022-05-09 LAB — URINE CULTURE

## 2022-06-14 ENCOUNTER — Encounter: Payer: Self-pay | Admitting: Obstetrics and Gynecology

## 2024-03-29 ENCOUNTER — Ambulatory Visit: Admitting: Student

## 2024-03-30 ENCOUNTER — Ambulatory Visit: Admitting: Student

## 2024-04-20 ENCOUNTER — Ambulatory Visit: Admitting: Student
